# Patient Record
Sex: Male | Born: 1955 | Hispanic: No | Marital: Married | Smoking: Never smoker
Health system: Southern US, Community
[De-identification: ages and names within clinical notes are randomized; demographics above are authoritative.]

## PROBLEM LIST (undated history)

## (undated) DIAGNOSIS — I10 Essential (primary) hypertension: Secondary | ICD-10-CM

## (undated) DIAGNOSIS — K219 Gastro-esophageal reflux disease without esophagitis: Secondary | ICD-10-CM

## (undated) HISTORY — DX: Gastro-esophageal reflux disease without esophagitis: K21.9

## (undated) HISTORY — DX: Essential (primary) hypertension: I10

---

## 2015-02-26 DIAGNOSIS — I10 Essential (primary) hypertension: Secondary | ICD-10-CM | POA: Insufficient documentation

## 2015-03-05 DIAGNOSIS — Z8249 Family history of ischemic heart disease and other diseases of the circulatory system: Secondary | ICD-10-CM | POA: Insufficient documentation

## 2015-04-17 DIAGNOSIS — E7800 Pure hypercholesterolemia, unspecified: Secondary | ICD-10-CM | POA: Insufficient documentation

## 2018-01-15 DIAGNOSIS — Z01810 Encounter for preprocedural cardiovascular examination: Secondary | ICD-10-CM | POA: Insufficient documentation

## 2018-06-06 LAB — HM COLONOSCOPY

## 2019-12-11 ENCOUNTER — Ambulatory Visit: Payer: Self-pay

## 2019-12-11 ENCOUNTER — Encounter: Payer: Self-pay | Admitting: Family Medicine

## 2019-12-11 ENCOUNTER — Ambulatory Visit (INDEPENDENT_AMBULATORY_CARE_PROVIDER_SITE_OTHER): Payer: Medicare Other | Admitting: Family Medicine

## 2019-12-11 ENCOUNTER — Other Ambulatory Visit: Payer: Self-pay

## 2019-12-11 DIAGNOSIS — R202 Paresthesia of skin: Secondary | ICD-10-CM | POA: Diagnosis not present

## 2019-12-11 DIAGNOSIS — M542 Cervicalgia: Secondary | ICD-10-CM | POA: Diagnosis not present

## 2019-12-11 DIAGNOSIS — R200 Anesthesia of skin: Secondary | ICD-10-CM

## 2019-12-11 MED ORDER — GABAPENTIN 100 MG PO CAPS: ORAL_CAPSULE | ORAL | 3 refills | Status: DC

## 2019-12-11 MED ORDER — PREDNISONE 10 MG PO TABS: ORAL_TABLET | ORAL | 0 refills | Status: DC

## 2019-12-11 MED ORDER — HYDROCODONE-ACETAMINOPHEN 5-325 MG PO TABS: 1.0000 | ORAL_TABLET | Freq: Every evening | ORAL | 0 refills | Status: DC | PRN

## 2019-12-11 NOTE — Progress Notes (Signed)
   Office Visit Note   Patient: Steven Sanford           Date of Birth: 1956/11/27           MRN: [id number] Visit Date: 12/11/2019 Requested by: No referring provider defined for this encounter. PCP: Patient, No Pcp Per  Subjective: Chief Complaint  Patient presents with  . Neck - Pain    7-79months ago-Tingling/numbness on left side. Pain in neck area. Getting worse. Difficult sleeping.Took Codiene. Would like something for nerve pain. Hurts to turn neck around. Weakness on left arm. Uses left hand alot but is right handed.     HPI: He is here with neck and left arm pain.  He is right-hand dominant.  About 6 or 7 months ago he started noticing numbness and tingling on the dorsum of his left hand and wrist.  The symptoms gradually got a little bit worse, extending proximally.  In the past month or 2, his neck started hurting on the left side with pain in the shoulder blade area as well.  He has not noticed any weakness.  He took some codeine for pain and it helped him to get some rest.  He is not from this area, but his 65 year old mother-in-law recently died and he is here for a few weeks to help settle her estate.  He has a history of bilateral shoulder dislocations many years ago and has done well from that standpoint.  He has never had any troubles with his neck.               ROS: He is otherwise in good health.  All other systems were reviewed and are negative.  Objective: Vital Signs: There were no vitals taken for this visit.  Physical Exam:  General:  Alert and oriented, in no acute distress. Pulm:  Breathing unlabored. Psy:  Normal mood, congruent affect. Skin: No visible rash. Neck: He has good range of motion with pain at the extreme of rotation to the left and upward gaze.  Spurling's test is equivocal.  He is tender to palpation to the left of C7-T1, and he also has a trigger point in the rhomboid area on the left that is symptomatic.  Upper extremity strength and  reflexes are normal.  Negative Tinel's at the carpal tunnel.  Light touch sensation is grossly normal.   Imaging: X-ray cervical spine: Overall well-preserved disc spaces.  Mild facet joint arthropathy.    Assessment & Plan: 1.  Neck and left arm pain, concerning for cervical radiculopathy -We will try physical therapy.  Medications given for pain relief.  If he fails to improve, MRI cervical spine followed by possible epidural injections.     Procedures: No procedures performed  No notes on file     PMFS History: There are no problems to display for this patient.  History reviewed. No pertinent past medical history.  History reviewed. No pertinent family history.  History reviewed. No pertinent surgical history. Social History   Occupational History  . Not on file  Tobacco Use  . Smoking status: Never Smoker  . Smokeless tobacco: Never Used  Substance and Sexual Activity  . Alcohol use: Not on file  . Drug use: Not on file  . Sexual activity: Not on file

## 2019-12-12 ENCOUNTER — Ambulatory Visit: Payer: Medicare Other | Attending: Family Medicine

## 2019-12-12 ENCOUNTER — Other Ambulatory Visit: Payer: Self-pay

## 2019-12-12 DIAGNOSIS — M79602 Pain in left arm: Secondary | ICD-10-CM | POA: Insufficient documentation

## 2019-12-12 DIAGNOSIS — R252 Cramp and spasm: Secondary | ICD-10-CM | POA: Insufficient documentation

## 2019-12-12 DIAGNOSIS — M546 Pain in thoracic spine: Secondary | ICD-10-CM | POA: Diagnosis present

## 2019-12-12 DIAGNOSIS — M542 Cervicalgia: Secondary | ICD-10-CM | POA: Diagnosis present

## 2019-12-12 NOTE — Patient Instructions (Addendum)
.  Cervico-Thoracic: Extension / Rotation (Sitting)    Reach across body with left arm and grasp back of chair. Gently look over right side shoulder. Hold __20__ seconds. Relax. Repeat __3__ times per set. Do _1___ sets per session. Do __3__ sessions per day.  [email or web address]   Copyright  VHI. All rights reserved.   Access Code: [email or web address]  URL: [email or web address]  Date: 12/12/2019  Prepared by: Sigurd Sos   Exercises Sidelying Open Book Thoracic Rotation with Knee on Foam Roll - 10 reps - 1 sets - 2x daily - 7x weekly Child's Pose Stretch - 10 reps - 3 sets - 1x daily - 7x weekly Child's Pose with Sidebending - 10 reps - 3 sets - 1x daily - 7x weekly Seated Correct Posture - 10 reps - 3 sets - 1x daily - 7x weekly  [facility] [address], [place], [place] [address] Phone # [phone] Fax [phone]

## 2019-12-12 NOTE — Therapy (Signed)
[facility] [address], [place], [place], [address] Phone: [phone]   Fax:  [phone]  Physical Therapy Evaluation  Patient Details  Name: Steven Sanford MRN: [id number] Date of Birth: 15-Feb-1956 Referring Provider (PT): Jerl Mina, MD   Encounter Date: 12/12/2019  PT End of Session - 12/12/19 1056    Visit Number  1    Date for PT Re-Evaluation  02/06/20    Authorization Type  UHC Medicare    PT Start Time  1017    PT Stop Time  1057    PT Time Calculation (min)  40 min    Activity Tolerance  Patient tolerated treatment well    Behavior During Therapy  [facility] for tasks assessed/performed       Past Medical History:  Diagnosis Date  . Hypertension     History reviewed. No pertinent surgical history.  There were no vitals filed for this visit.   Subjective Assessment - 12/12/19 0956    Subjective  Pt presents to PT with neck pain and Lt UE numbness and tingling that began 8 months ago.  Pt had a recent flare-up when driving from [place] 2 weeks ago.    Diagnostic tests  x-ray: normal joint space in the cervical spine    Patient Stated Goals  reduce Lt UE radiculopathy    Currently in Pain?  Yes    Pain Score  6    up to 8-9/10   Pain Location  Arm   cervical   Pain Orientation  Left    Pain Descriptors / Indicators  Burning;Stabbing;Pressure;Spasm    Pain Type  Chronic pain    Pain Radiating Towards  Lt scapula to Lt UE    Pain Onset  More than a month ago    Pain Frequency  Intermittent    Aggravating Factors   sitting, driving, sleep    Pain Relieving Factors  change of position         [facility] PT Assessment - 12/12/19 0001      Assessment   Medical Diagnosis  neck pain, numbness and tingling in Lt arm    Referring Provider (PT)  Jerl Mina, MD    Onset Date/Surgical Date  11/12/19    Hand Dominance  Right    Next MD Visit  if needed      Precautions   Precautions  None       Restrictions   Weight Bearing Restrictions  No      Balance Screen   Has the patient fallen in the past 6 months  No    Has the patient had a decrease in activity level because of a fear of falling?   No    Is the patient reluctant to leave their home because of a fear of falling?   No      Home Film/video editor residence    Living Arrangements  Spouse/significant other      Prior Function   Level of Independence  Independent    Vocation  Part time employment      Cognition   Overall Cognitive Status  Within Functional Limits for tasks assessed      Observation/Other Assessments   Focus on Therapeutic Outcomes (FOTO)   42% limitation      Posture/Postural Control   Posture/Postural Control  Postural limitations    Postural Limitations  Forward head;Rounded Shoulders      ROM / Strength  AROM / PROM / Strength  AROM;PROM;Strength      AROM   Overall AROM   Deficits    Overall AROM Comments  UE A/ROM is limited by 25% due to flexibility.  Thoracic mobility and A/ROM is limited by 75% into flexion and extension and 50% in bil sidebending    AROM Assessment Site  Cervical    Cervical Flexion  45    Cervical - Right Side Bend  35    Cervical - Left Side Bend  30    Cervical - Right Rotation  80    Cervical - Left Rotation  80      PROM   Overall PROM   Within functional limits for tasks performed    Overall PROM Comments  full passive cervical A/ROm      Strength   Overall Strength  Within functional limits for tasks performed    Overall Strength Comments  Rt=Lt UE strength 4+/5 to 5/5      Palpation   Spinal mobility  significant cervical and thoracic mobility with pain T2-8    Palpation comment  trigger points in Lt rhomboids, subscapularis, thoracic paraspinals and Lt>Rt cervical paraspinals      Special Tests    Special Tests  Cervical    Cervical Tests  Spurling's;Dictraction      Spurling's   Findings  Negative    Side  Left       Distraction Test   Findngs  Negative    side  Left      Ambulation/Gait   Gait Pattern  Within Functional Limits                Objective measurements completed on examination: See above findings.              PT Education - 12/12/19 1055    Education Details  Access Code: [email or web address]    Person(s) Educated  Patient    Methods  Explanation;Demonstration;Handout    Comprehension  Verbalized understanding;Returned demonstration       PT Short Term Goals - 12/12/19 1010      PT SHORT TERM GOAL #1   Title  be independent in intial HEP    Time  4    Period  Weeks    Status  New    Target Date  01/09/20      PT SHORT TERM GOAL #2   Title  reduce Lt UE pain by 25% with sitting and driving    Time  4    Period  Weeks    Status  New    Target Date  01/09/20      PT SHORT TERM GOAL #3   Title  improve sleep quality and quantity by 25%    Time  4    Period  Weeks    Status  New    Target Date  01/09/20        PT Long Term Goals - 12/12/19 1010      PT LONG TERM GOAL #1   Title  be independent in advanced HEP    Time  8    Period  Weeks    Target Date  02/06/20      PT LONG TERM GOAL #2   Title  reduce FOTO to < or = to 29% limitation    Time  8    Period  Weeks    Status  New    Target Date  02/06/20  PT LONG TERM GOAL #3   Title  reduce Lt UE radiculopathy by 60% to improve sitting and driving tolerance    Time  8    Period  Weeks    Status  New    Target Date  02/06/20      PT LONG TERM GOAL #4   Title  demonstrate neutral seated posture and report frequent corrections during the day to reduce muscle tension    Time  8    Period  Weeks    Status  New    Target Date  02/06/20      PT LONG TERM GOAL #5   Title  report a 60% improvement in the quality and quantity of sleep    Time  8    Period  Weeks    Status  New    Target Date  02/06/20             Plan - 12/12/19 1231    Clinical Impression Statement  Pt  presents to PT with an 8 month history of neck pain and Lt UE radiculopathy.  Pt drove from [place] ~2-3 weeks ago and began to have increased Lt UE pain after that drive. Pt had x-ray yesterday and disc space was normal.  Pt rates the pain as 5-8/10 overall in the Lt neck and UE.  Pt describes pain in his Lt scapula as stabbing and intense.  Pt with significant guarding in sitting with scapular elevation and forward head.  Pt with reduced cervical and UE A/ROM and significant reduction in thoracic mobility and A/ROM.  Pt will benefit from skilled PT to address neck pain, Lt UE radiculopathy, postural dysfunction and cervical/thoracic mobility.    Personal Factors and Comorbidities  Age    Examination-Activity Limitations  Lift    Examination-Participation Restrictions  Driving    Stability/Clinical Decision Making  Stable/Uncomplicated    Clinical Decision Making  Low    Rehab Potential  Good    PT Frequency  2x / week    PT Duration  8 weeks    PT Treatment/Interventions  ADLs/Self Care Home Management;Cryotherapy;Electrical Stimulation;Ultrasound;Traction;Moist Heat;Functional mobility training;Therapeutic activities;Therapeutic exercise;Neuromuscular re-education;Manual techniques;Patient/family education;Passive range of motion;Dry needling;Spinal Manipulations    PT Next Visit Plan  dry needling to neck, thoracic and rhomboids/subscapularis, thoracic mobility, cervical traction    PT Home Exercise Plan  Access Code: [email or web address]    Consulted and Agree with Plan of Care  Patient       Patient will benefit from skilled therapeutic intervention in order to improve the following deficits and impairments:  Decreased activity tolerance, Decreased endurance, Postural dysfunction, Improper body mechanics, Impaired flexibility, Pain, Increased muscle spasms, Decreased range of motion, Hypomobility  Visit Diagnosis: Pain in left arm - Plan: PT plan of care cert/re-cert  Cervicalgia - Plan: PT plan  of care cert/re-cert  Pain in thoracic spine - Plan: PT plan of care cert/re-cert  Cramp and spasm - Plan: PT plan of care cert/re-cert     Problem List There are no problems to display for this patient.    Sigurd Sos, PT 12/12/19 1:14 PM  [facility] [address] [place], [place], [address] Phone: [phone]   Fax:  [phone]  Name: Jatin Kingry MRN: [id number] Date of Birth: 1956-01-24

## 2019-12-16 ENCOUNTER — Other Ambulatory Visit: Payer: Self-pay

## 2019-12-16 ENCOUNTER — Ambulatory Visit: Payer: Medicare Other

## 2019-12-16 DIAGNOSIS — M79602 Pain in left arm: Secondary | ICD-10-CM

## 2019-12-16 DIAGNOSIS — M546 Pain in thoracic spine: Secondary | ICD-10-CM

## 2019-12-16 DIAGNOSIS — M542 Cervicalgia: Secondary | ICD-10-CM

## 2019-12-16 NOTE — Patient Instructions (Signed)

## 2019-12-16 NOTE — Therapy (Signed)
[facility] [address], [place], [place], [address] Phone: [phone]   Fax:  [phone]  Physical Therapy Treatment  Patient Details  Name: Steven Sanford MRN: [id number] Date of Birth: Feb 21, 1956 Referring Provider (PT): Jerl Mina, MD   Encounter Date: 12/16/2019  PT End of Session - 12/16/19 1310    Visit Number  2    Date for PT Re-Evaluation  02/06/20    Authorization Type  UHC Medicare    PT Start Time  1235   dry needling   PT Stop Time  1312    PT Time Calculation (min)  37 min    Activity Tolerance  Patient tolerated treatment well    Behavior During Therapy  [facility] for tasks assessed/performed       Past Medical History:  Diagnosis Date  . Hypertension     History reviewed. No pertinent surgical history.  There were no vitals filed for this visit.  Subjective Assessment - 12/16/19 1236    Subjective  I think the prednisone is helping.  I am sleeping better now.  I have not been doing the stretches- I have been very busy    Diagnostic tests  x-ray: normal joint space in the cervical spine    Patient Stated Goals  reduce Lt UE radiculopathy    Currently in Pain?  Yes    Pain Score  3     Pain Location  Arm    Pain Orientation  Left    Pain Descriptors / Indicators  Burning;Pressure;Spasm    Pain Type  Chronic pain    Pain Onset  More than a month ago    Aggravating Factors   siting, driving, sleep    Pain Relieving Factors  change of position                       [facility] Adult PT Treatment/Exercise - 12/16/19 0001      Manual Therapy   Manual Therapy  Soft tissue mobilization;Myofascial release    Manual therapy comments  elongation to Lt rhomboids, subscapularis, cervical paraspinals       Trigger Point Dry Needling - 12/16/19 0001    Consent Given?  Yes    Education Handout Provided  Yes    Muscles Treated Head and Neck  Upper trapezius;Cervical multifidi     Muscles Treated Upper Quadrant  Rhomboids;Subscapularis   Lt only   Other Dry Needling  thoracic multifidi    Upper Trapezius Response  Twitch reponse elicited;Palpable increased muscle length    Cervical multifidi Response  Twitch reponse elicited;Palpable increased muscle length    Rhomboids Response  Twitch response elicited;Palpable increased muscle length    Subscapularis Response  Twitch response elicited;Palpable increased muscle length           PT Education - 12/16/19 1239    Education Details  DN info    Person(s) Educated  Patient    Methods  Explanation;Demonstration;Handout    Comprehension  Verbalized understanding;Returned demonstration       PT Short Term Goals - 12/12/19 1010      PT SHORT TERM GOAL #1   Title  be independent in intial HEP    Time  4    Period  Weeks    Status  New    Target Date  01/09/20      PT SHORT TERM GOAL #2   Title  reduce Lt UE pain by 25% with sitting and  driving    Time  4    Period  Weeks    Status  New    Target Date  01/09/20      PT SHORT TERM GOAL #3   Title  improve sleep quality and quantity by 25%    Time  4    Period  Weeks    Status  New    Target Date  01/09/20        PT Long Term Goals - 12/12/19 1010      PT LONG TERM GOAL #1   Title  be independent in advanced HEP    Time  8    Period  Weeks    Target Date  02/06/20      PT LONG TERM GOAL #2   Title  reduce FOTO to < or = to 29% limitation    Time  8    Period  Weeks    Status  New    Target Date  02/06/20      PT LONG TERM GOAL #3   Title  reduce Lt UE radiculopathy by 60% to improve sitting and driving tolerance    Time  8    Period  Weeks    Status  New    Target Date  02/06/20      PT LONG TERM GOAL #4   Title  demonstrate neutral seated posture and report frequent corrections during the day to reduce muscle tension    Time  8    Period  Weeks    Status  New    Target Date  02/06/20      PT LONG TERM GOAL #5   Title  report a  60% improvement in the quality and quantity of sleep    Time  8    Period  Weeks    Status  New    Target Date  02/06/20            Plan - 12/16/19 1316    Clinical Impression Statement  Pt with first time follow-up today.  Pt with reduced compliance with HEP due to death in his family and being busy with arrangements.  Pt with reduced pain overall due to taking Prednisone for the past 5 days.  Pt with significant muscle spasm in Lt rhomboids and moderate tension in thoracic and cervical multifidi.  Pt with reduced pain and tension after manual therapy today.  Pt will benefit from skilled PT for thoracic and cervical flexibility, manual to address reduced segmental and muscular mobility.    PT Frequency  2x / week    PT Duration  8 weeks    PT Treatment/Interventions  ADLs/Self Care Home Management;Cryotherapy;Electrical Stimulation;Ultrasound;Traction;Moist Heat;Functional mobility training;Therapeutic activities;Therapeutic exercise;Neuromuscular re-education;Manual techniques;Patient/family education;Passive range of motion;Dry needling;Spinal Manipulations    PT Next Visit Plan  assess response to dry needling, thoracic mobility, manual to Lt rhomboids, cervical traction    PT Home Exercise Plan  Access Code: [email or web address]    Consulted and Agree with Plan of Care  Patient       Patient will benefit from skilled therapeutic intervention in order to improve the following deficits and impairments:  Decreased activity tolerance, Decreased endurance, Postural dysfunction, Improper body mechanics, Impaired flexibility, Pain, Increased muscle spasms, Decreased range of motion, Hypomobility  Visit Diagnosis: Cervicalgia  Pain in left arm  Pain in thoracic spine     Problem List There are no problems to display for this patient.    Sigurd Sos,  PT 12/16/19 1:17 PM  [facility] [address], [place], [place],  [address] Phone: [phone]   Fax:  [phone]  Name: Raidel Haney MRN: [id number] Date of Birth: 02-Jun-1956

## 2019-12-18 ENCOUNTER — Other Ambulatory Visit: Payer: Self-pay

## 2019-12-18 ENCOUNTER — Ambulatory Visit: Payer: Medicare Other

## 2019-12-18 DIAGNOSIS — M542 Cervicalgia: Secondary | ICD-10-CM

## 2019-12-18 DIAGNOSIS — M546 Pain in thoracic spine: Secondary | ICD-10-CM

## 2019-12-18 DIAGNOSIS — R252 Cramp and spasm: Secondary | ICD-10-CM

## 2019-12-18 DIAGNOSIS — M79602 Pain in left arm: Secondary | ICD-10-CM | POA: Diagnosis not present

## 2019-12-18 NOTE — Therapy (Signed)
[facility] [address], [place], [place], [address] Phone: [phone]   Fax:  [phone]  Physical Therapy Treatment  Patient Details  Name: Steven Sanford MRN: [id number] Date of Birth: [birth year] Referring Provider (PT): Jerl Mina, MD   Encounter Date: 12/18/2019  PT End of Session - 12/18/19 1228    Visit Number  3    Date for PT Re-Evaluation  02/06/20    Authorization Type  UHC Medicare    PT Start Time  1147   dry needling   PT Stop Time  [id number]    PT Time Calculation (min)  48 min    Activity Tolerance  Patient tolerated treatment well    Behavior During Therapy  [facility] for tasks assessed/performed       Past Medical History:  Diagnosis Date  . Hypertension     History reviewed. No pertinent surgical history.  There were no vitals filed for this visit.  Subjective Assessment - 12/18/19 1150    Subjective  I am not taking pain medication now.  I was sore after treatment and now I feel better.    Currently in Pain?  Yes    Pain Score  2     Pain Location  Arm    Pain Orientation  Left    Pain Descriptors / Indicators  Tingling;Burning                       [facility] Adult PT Treatment/Exercise - 12/18/19 0001      Modalities   Modalities  Traction      Traction   Type of Traction  Cervical    Min (lbs)  10    Max (lbs)  15    Hold Time  60    Rest Time  20    Time  10      Manual Therapy   Manual Therapy  Soft tissue mobilization;Myofascial release    Manual therapy comments  elongation to Lt rhomboids, subscapularis, cervical paraspinals       Trigger Point Dry Needling - 12/18/19 0001    Consent Given?  Yes    Muscles Treated Head and Neck  Upper trapezius;Cervical multifidi    Muscles Treated Upper Quadrant  Rhomboids;Subscapularis   Lt only   Other Dry Needling  thoracic multifidi    Upper Trapezius Response  Twitch reponse elicited;Palpable increased muscle  length    Cervical multifidi Response  Twitch reponse elicited;Palpable increased muscle length    Rhomboids Response  Twitch response elicited;Palpable increased muscle length    Subscapularis Response  Twitch response elicited;Palpable increased muscle length             PT Short Term Goals - 12/12/19 1010      PT SHORT TERM GOAL #1   Title  be independent in intial HEP    Time  4    Period  Weeks    Status  New    Target Date  01/09/20      PT SHORT TERM GOAL #2   Title  reduce Lt UE pain by 25% with sitting and driving    Time  4    Period  Weeks    Status  New    Target Date  01/09/20      PT SHORT TERM GOAL #3   Title  improve sleep quality and quantity by 25%    Time  4    Period  Weeks  Status  New    Target Date  01/09/20        PT Long Term Goals - 12/12/19 1010      PT LONG TERM GOAL #1   Title  be independent in advanced HEP    Time  8    Period  Weeks    Target Date  02/06/20      PT LONG TERM GOAL #2   Title  reduce FOTO to < or = to 29% limitation    Time  8    Period  Weeks    Status  New    Target Date  02/06/20      PT LONG TERM GOAL #3   Title  reduce Lt UE radiculopathy by 60% to improve sitting and driving tolerance    Time  8    Period  Weeks    Status  New    Target Date  02/06/20      PT LONG TERM GOAL #4   Title  demonstrate neutral seated posture and report frequent corrections during the day to reduce muscle tension    Time  8    Period  Weeks    Status  New    Target Date  02/06/20      PT LONG TERM GOAL #5   Title  report a 60% improvement in the quality and quantity of sleep    Time  8    Period  Weeks    Status  New    Target Date  02/06/20            Plan - 12/18/19 1227    Clinical Impression Statement  Pt reports reduced Lt scapular pain after manual last session.  Pt with significant spasm in Lt rhomboids and responded well to manual therapy today with reduced size and tension in spasm after dry  needling and soft tissue work. Pt has been more consistent with HEP for flexibility.  Pt tolerated trial of traction well today.  Pt will continue to benefit from skilled PT to address Lt UE radiculopathy and muscle tension.    PT Frequency  2x / week    PT Duration  8 weeks    PT Treatment/Interventions  ADLs/Self Care Home Management;Cryotherapy;Electrical Stimulation;Ultrasound;Traction;Moist Heat;Functional mobility training;Therapeutic activities;Therapeutic exercise;Neuromuscular re-education;Manual techniques;Patient/family education;Passive range of motion;Dry needling;Spinal Manipulations    PT Next Visit Plan  assess response to dry needling, thoracic mobility, manual to Lt rhomboids, cervical traction if helpful    PT Home Exercise Plan  Access Code: [email or web address]    Recommended Other Services  initial certification is signed    Consulted and Agree with Plan of Care  Patient       Patient will benefit from skilled therapeutic intervention in order to improve the following deficits and impairments:  Decreased activity tolerance, Decreased endurance, Postural dysfunction, Improper body mechanics, Impaired flexibility, Pain, Increased muscle spasms, Decreased range of motion, Hypomobility  Visit Diagnosis: Cervicalgia  Pain in left arm  Pain in thoracic spine  Cramp and spasm     Problem List There are no problems to display for this patient.   Sigurd Sos, PT 12/18/19 12:30 PM  [facility] [address] [place], [place], [address] Phone: [phone]   Fax:  [phone]  Name: Taria Wisecup MRN: [id number] Date of Birth: June 18, 1956

## 2019-12-25 ENCOUNTER — Ambulatory Visit: Payer: Medicare Other

## 2019-12-25 ENCOUNTER — Other Ambulatory Visit: Payer: Self-pay

## 2019-12-25 DIAGNOSIS — R252 Cramp and spasm: Secondary | ICD-10-CM

## 2019-12-25 DIAGNOSIS — M542 Cervicalgia: Secondary | ICD-10-CM

## 2019-12-25 DIAGNOSIS — M79602 Pain in left arm: Secondary | ICD-10-CM | POA: Diagnosis not present

## 2019-12-25 DIAGNOSIS — M546 Pain in thoracic spine: Secondary | ICD-10-CM

## 2019-12-25 NOTE — Therapy (Signed)
[facility] [address], [place], [place], [address] Phone: [phone]   Fax:  [phone]  Physical Therapy Treatment  Patient Details  Name: Steven Sanford MRN: [id number] Date of Birth: 12-28-1955 Referring Provider (PT): Jerl Mina, MD   Encounter Date: 12/25/2019  PT End of Session - 12/25/19 1613    Visit Number  4    Date for PT Re-Evaluation  02/06/20    Authorization Type  [employer] Medicare    PT Start Time  [id number]    PT Stop Time  1622    PT Time Calculation (min)  48 min    Activity Tolerance  Patient tolerated treatment well    Behavior During Therapy  [facility] for tasks assessed/performed       Past Medical History:  Diagnosis Date  . Hypertension     History reviewed. No pertinent surgical history.  There were no vitals filed for this visit.  Subjective Assessment - 12/25/19 1536    Subjective  The muscle tension is down.  My Lt was very painful last night at night.    Pertinent History  Pt lives in [place]- he is here due to death of his mother in law    Patient Stated Goals  reduce Lt UE radiculopathy    Currently in Pain?  Yes    Pain Score  3    up to 7-8/10   Pain Location  Arm    Pain Orientation  Left    Pain Descriptors / Indicators  Tingling    Pain Type  Chronic pain    Pain Onset  More than a month ago    Pain Frequency  Constant    Aggravating Factors   sleep, sitting too long at computer    Pain Relieving Factors  chang of position, topical rub                       [facility] Adult PT Treatment/Exercise - 12/25/19 0001      Modalities   Modalities  Traction      Traction   Type of Traction  Cervical    Min (lbs)  10    Max (lbs)  15    Hold Time  60    Rest Time  20    Time  10      Manual Therapy   Manual Therapy  Soft tissue mobilization;Myofascial release    Manual therapy comments  elongation to Lt rhomboids, subscapularis, cervical paraspinals        Trigger Point Dry Needling - 12/25/19 0001    Consent Given?  Yes    Muscles Treated Upper Quadrant  Rhomboids;Subscapularis   Lt only   Other Dry Needling  thoracic multifidi    Rhomboids Response  Twitch response elicited;Palpable increased muscle length    Subscapularis Response  Twitch response elicited;Palpable increased muscle length             PT Short Term Goals - 12/25/19 1538      PT SHORT TERM GOAL #1   Title  be independent in intial HEP    Status  Achieved      PT SHORT TERM GOAL #2   Title  reduce Lt UE pain by 25% with sitting and driving    Baseline  50%    Status  Achieved      PT SHORT TERM GOAL #3   Title  improve sleep quality and quantity by 25%  Baseline  50% improvement    Status  Achieved        PT Long Term Goals - 12/12/19 1010      PT LONG TERM GOAL #1   Title  be independent in advanced HEP    Time  8    Period  Weeks    Target Date  02/06/20      PT LONG TERM GOAL #2   Title  reduce FOTO to < or = to 29% limitation    Time  8    Period  Weeks    Status  New    Target Date  02/06/20      PT LONG TERM GOAL #3   Title  reduce Lt UE radiculopathy by 60% to improve sitting and driving tolerance    Time  8    Period  Weeks    Status  New    Target Date  02/06/20      PT LONG TERM GOAL #4   Title  demonstrate neutral seated posture and report frequent corrections during the day to reduce muscle tension    Time  8    Period  Weeks    Status  New    Target Date  02/06/20      PT LONG TERM GOAL #5   Title  report a 60% improvement in the quality and quantity of sleep    Time  8    Period  Weeks    Status  New    Target Date  02/06/20            Plan - 12/25/19 1612    Clinical Impression Statement  Pt with 50% reduction in Lt UE and rhomboid pain since the start of care and improved sleep.  Pt with increased Lt UE radiculopathy last night with sleep.  Pt with increased Lt UE tingling with rounded shoulder  posture and cervical rotation.  Radiculopathy resolves with scapular retraction.  Pt with trigger point over Lt rhomboids and demonstrated improved tissue mobility after manual therapy and dry needling today.  Pt will continue to benefit from skilled PT to address muscle tension, pain and Lt UE radiculopathy.    PT Frequency  2x / week    PT Duration  8 weeks    PT Treatment/Interventions  ADLs/Self Care Home Management;Cryotherapy;Electrical Stimulation;Ultrasound;Traction;Moist Heat;Functional mobility training;Therapeutic activities;Therapeutic exercise;Neuromuscular re-education;Manual techniques;Patient/family education;Passive range of motion;Dry needling;Spinal Manipulations    PT Next Visit Plan  assess response to dry needling, thoracic mobility, manual to Lt rhomboids, cervical traction if helpful.  Add postural strength exercises    PT Home Exercise Plan  Access Code: [email or web address]    Consulted and Agree with Plan of Care  Patient       Patient will benefit from skilled therapeutic intervention in order to improve the following deficits and impairments:  Decreased activity tolerance, Decreased endurance, Postural dysfunction, Improper body mechanics, Impaired flexibility, Pain, Increased muscle spasms, Decreased range of motion, Hypomobility  Visit Diagnosis: Cervicalgia  Pain in left arm  Pain in thoracic spine  Cramp and spasm     Problem List There are no problems to display for this patient.   Sigurd Sos, PT 12/25/19 4:18 PM  [facility] [address] [place], [place], [address] Phone: [phone]   Fax:  [phone]  Name: Steven Sanford MRN: [id number] Date of Birth: 1956/02/07

## 2019-12-31 ENCOUNTER — Ambulatory Visit: Payer: Medicare Other | Attending: Family Medicine

## 2019-12-31 ENCOUNTER — Other Ambulatory Visit: Payer: Self-pay

## 2019-12-31 DIAGNOSIS — M546 Pain in thoracic spine: Secondary | ICD-10-CM | POA: Diagnosis present

## 2019-12-31 DIAGNOSIS — R252 Cramp and spasm: Secondary | ICD-10-CM | POA: Insufficient documentation

## 2019-12-31 DIAGNOSIS — M79602 Pain in left arm: Secondary | ICD-10-CM | POA: Diagnosis present

## 2019-12-31 DIAGNOSIS — M542 Cervicalgia: Secondary | ICD-10-CM | POA: Insufficient documentation

## 2019-12-31 NOTE — Therapy (Addendum)
[facility] [address] [place], [place], [address] Phone: [phone]   Fax:  [phone]  Physical Therapy Treatment  Patient Details  Name: Steven Sanford MRN: [id number] Date of Birth: 1956-10-07 Referring Provider (PT): Eunice Blase, MD    Encounter Date: 12/31/2019  PT End of Session - 12/31/19 1226    Visit Number  5    Date for PT Re-Evaluation  02/06/20    Authorization Type  [employer] Medicare    PT Start Time  1941    PT Stop Time  1236    PT Time Calculation (min)  48 min    Activity Tolerance  Patient tolerated treatment well       Past Medical History:  Diagnosis Date  . Hypertension     History reviewed. No pertinent surgical history.  There were no vitals filed for this visit.  Subjective Assessment - 12/31/19 1151    Subjective  I had a rough night last night.  Pt reports 40% overall improvement since the start of care.    Currently in Pain?  Yes    Pain Score  3    up to 5-6/10   Pain Location  Arm    Pain Orientation  Left    Pain Descriptors / Indicators  Tingling    Pain Type  Chronic pain    Pain Radiating Towards  Lt scapula to the Lt UE    Pain Onset  More than a month ago    Pain Frequency  Constant    Aggravating Factors   sleep, sitting too long at computer    Pain Relieving Factors  change of position, topical rub                       [facility] Adult PT Treatment/Exercise - 12/31/19 0001      Traction   Type of Traction  Cervical    Min (lbs)  10    Max (lbs)  18    Hold Time  60    Rest Time  20    Time  10      Manual Therapy   Manual Therapy  Soft tissue mobilization;Myofascial release    Manual therapy comments  elongation to Lt rhomboids, subscapularis, cervical paraspinals       Trigger Point Dry Needling - 12/31/19 0001    Consent Given?  Yes    Muscles Treated Head and Neck  Upper trapezius;Cervical multifidi    Muscles Treated Upper  Quadrant  Rhomboids;Subscapularis   Lt only   Other Dry Needling  thoracic multifidi    Upper Trapezius Response  Twitch reponse elicited;Palpable increased muscle length    Cervical multifidi Response  Twitch reponse elicited;Palpable increased muscle length    Rhomboids Response  Twitch response elicited;Palpable increased muscle length    Subscapularis Response  Twitch response elicited;Palpable increased muscle length             PT Short Term Goals - 12/25/19 1538      PT SHORT TERM GOAL #1   Title  be independent in intial HEP    Status  Achieved      PT SHORT TERM GOAL #2   Title  reduce Lt UE pain by 25% with sitting and driving    Baseline  50%    Status  Achieved      PT SHORT TERM GOAL #3   Title  improve sleep quality and quantity by 25%  Baseline  50% improvement    Status  Achieved        PT Long Term Goals - 12/31/19 1154      PT LONG TERM GOAL #1   Title  be independent in advanced HEP    Period  Weeks    Status  On-going      PT LONG TERM GOAL #3   Title  reduce Lt UE radiculopathy by 60% to improve sitting and driving tolerance    Baseline  40% limitation    Time  8    Period  Weeks    Status  On-going      PT LONG TERM GOAL #4   Title  demonstrate neutral seated posture and report frequent corrections during the day to reduce muscle tension    Time  8    Period  Weeks    Status  On-going            Plan - 12/31/19 1224    Clinical Impression Statement  Pt with continued Lt UE pain that is worse at night.  Pt reports 40-50% overall improvement in symptoms since the start of care.  Pt has tension and trigger points in upper cervical spine and Lt rhomboids that are improved significantly. Pt demonstrated improved tissue mobility after manual therapy today.  PT increased the traction tension today and pt tolerated it well.  Pt will continue to benefit from continued PT to address Lt UE radiculopathy.    PT Frequency  2x / week    PT  Duration  8 weeks    PT Treatment/Interventions  ADLs/Self Care Home Management;Cryotherapy;Electrical Stimulation;Ultrasound;Traction;Moist Heat;Functional mobility training;Therapeutic activities;Therapeutic exercise;Neuromuscular re-education;Manual techniques;Patient/family education;Passive range of motion;Dry needling;Spinal Manipulations    PT Next Visit Plan  assess response to dry needling, thoracic mobility, manual to Lt rhomboids, continue cervical traction if helpful.  Add postural strength exercises when pt returns after trip    PT Home Exercise Plan  Access Code: [facility]       Patient will benefit from skilled therapeutic intervention in order to improve the following deficits and impairments:  Decreased activity tolerance, Decreased endurance, Postural dysfunction, Improper body mechanics, Impaired flexibility, Pain, Increased muscle spasms, Decreased range of motion, Hypomobility  Visit Diagnosis: Cervicalgia  Pain in left arm  Pain in thoracic spine  Cramp and spasm     Problem List There are no problems to display for this patient.  PHYSICAL THERAPY DISCHARGE SUMMARY  Visits from Start of Care: 5  Current functional level related to goals / functional outcomes: See above for current status.  Pt didn't return to PT.     Remaining deficits: See above for current status.     Education / Equipment: HEP, posture Plan: Patient agrees to discharge.  Patient goals were not met. Patient is being discharged due to not returning since the last visit.  ?????        Steven Sanford, PT 12/31/19 12:27 PM  [facility] [address] [place], [place], [address] Phone: [phone]   Fax:  [phone]  Name: Steven Sanford MRN: [id number] Date of Birth: April 17, 1956

## 2020-01-02 ENCOUNTER — Other Ambulatory Visit: Payer: Self-pay

## 2020-01-02 ENCOUNTER — Ambulatory Visit: Payer: 59 | Attending: Family

## 2020-01-02 DIAGNOSIS — Z20822 Contact with and (suspected) exposure to covid-19: Secondary | ICD-10-CM

## 2020-01-04 LAB — NOVEL CORONAVIRUS, NAA: SARS-CoV-2, NAA: NOT DETECTED

## 2020-01-15 ENCOUNTER — Ambulatory Visit: Payer: Medicare Other

## 2020-07-27 ENCOUNTER — Telehealth: Payer: Self-pay | Admitting: Family Medicine

## 2020-07-27 DIAGNOSIS — R200 Anesthesia of skin: Secondary | ICD-10-CM

## 2020-07-27 DIAGNOSIS — M542 Cervicalgia: Secondary | ICD-10-CM

## 2020-07-27 NOTE — Telephone Encounter (Signed)
Please advise 

## 2020-07-27 NOTE — Telephone Encounter (Signed)
Ordered

## 2020-07-27 NOTE — Telephone Encounter (Signed)
Pt called stating the pain has persisted and he would like to proceed with an MRI.  [phone]

## 2020-07-27 NOTE — Telephone Encounter (Signed)
Left voice mail that the MRI has been ordered. He will be getting a call from the imaging facility to schedule.

## 2020-08-23 ENCOUNTER — Ambulatory Visit
Admission: RE | Admit: 2020-08-23 | Discharge: 2020-08-23 | Disposition: A | Discharge status: Home / Self Care | Payer: Medicare Other | Source: Ambulatory Visit | Attending: Family Medicine | Admitting: Family Medicine

## 2020-08-23 DIAGNOSIS — M542 Cervicalgia: Secondary | ICD-10-CM

## 2020-08-23 IMAGING — MR MR CERVICAL SPINE W/O CM
5 series · 35 of 48 positions shown · non-contrast
Comparison: Cervical spine radiographs [DATE]

CLINICAL DATA: Chronic neck pain.  Left arm pain

EXAM:
MRI CERVICAL SPINE WITHOUT CONTRAST
TECHNIQUE: Multiplanar, multisequence MR imaging of the cervical spine was
performed. No intravenous contrast was administered.

[Series 2: T2 · sagittal · 3.0mm · 0.41mm/px · 8 of 17 slices shown (1 of 2)]
[im 1/17]
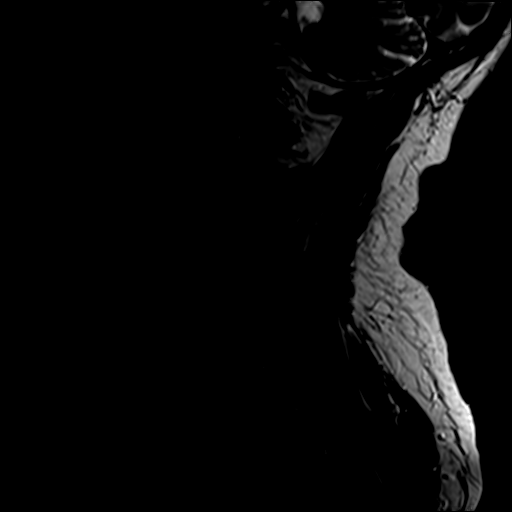
[im 3/17]
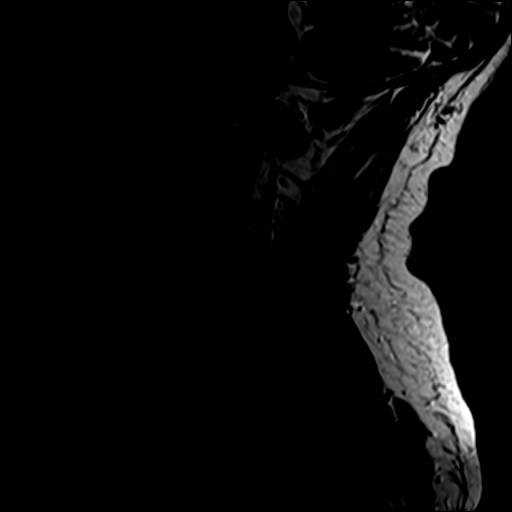
[im 5/17]
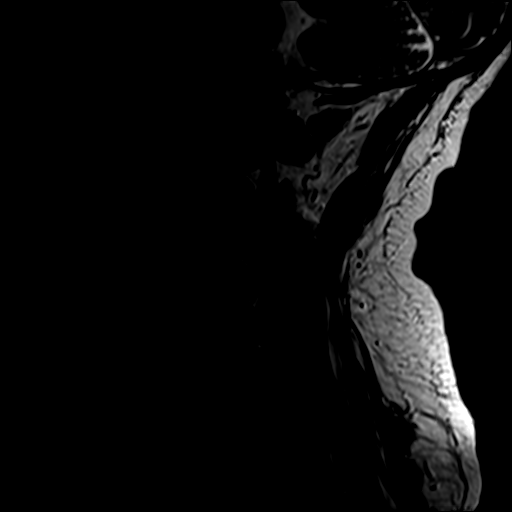
[im 7/17]
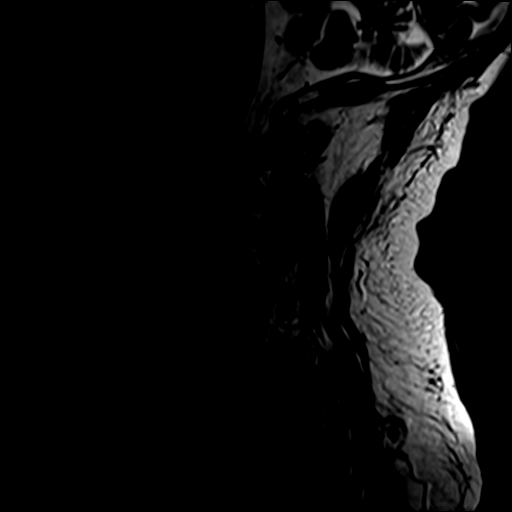
[im 10/17]
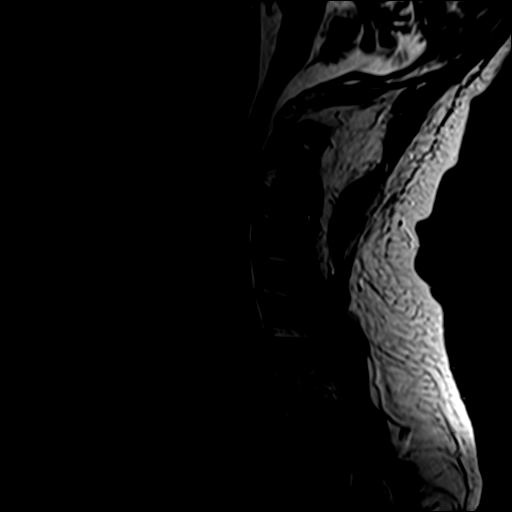
[im 12/17]
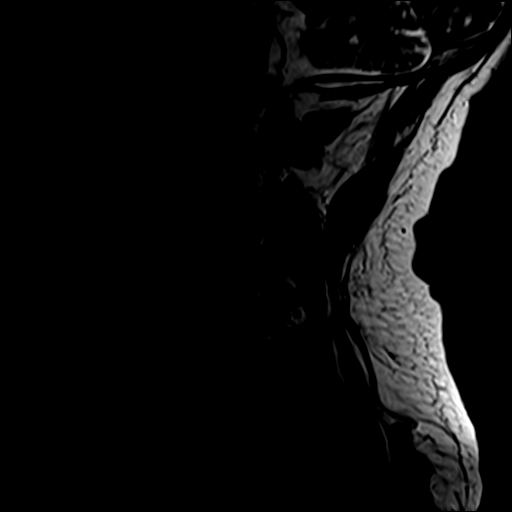
[im 14/17]
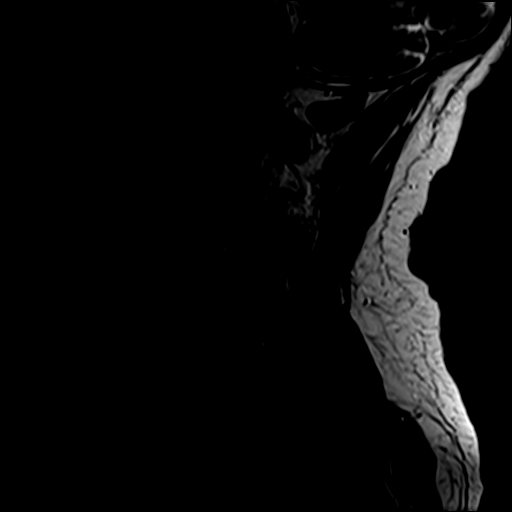
[im 17/17]
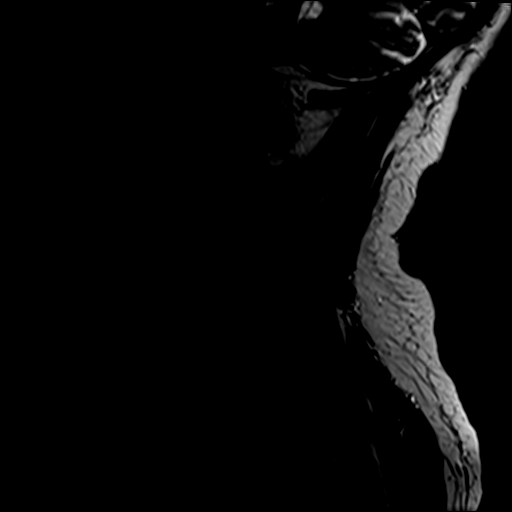

[Series 3: STIR · sagittal · 3.0mm · 0.82mm/px · 7 of 17 slices shown]
[im 1/17]
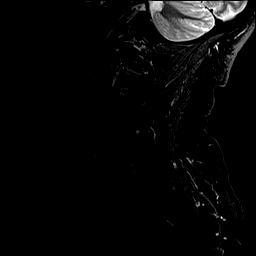
[im 3/17]
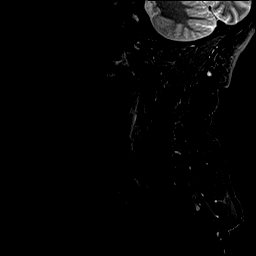
[im 6/17]
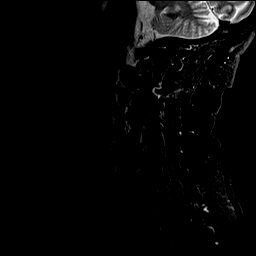
[im 9/17]
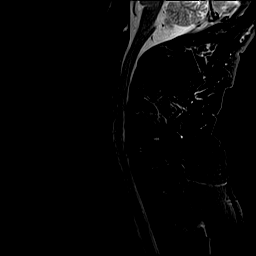
[im 11/17]
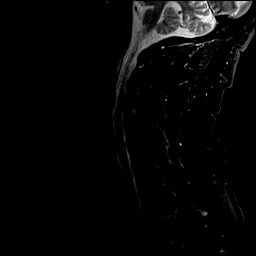
[im 14/17]
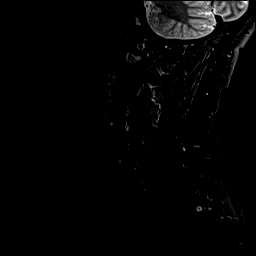
[im 17/17]
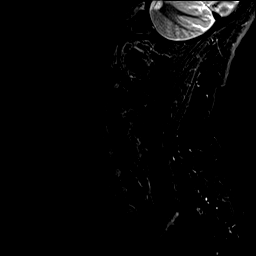

[Series 4: T1 · sagittal · 3.0mm · 0.82mm/px · 7 of 17 slices shown]
[im 1/17]
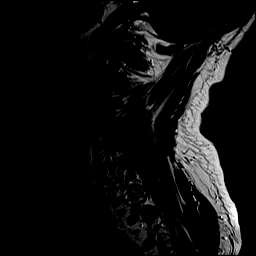
[im 3/17]
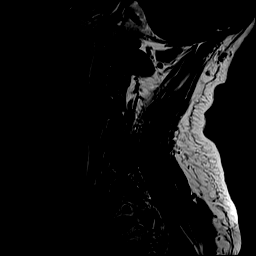
[im 6/17]
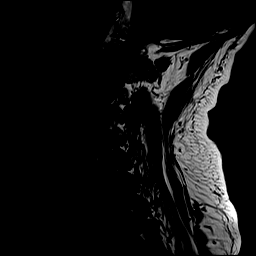
[im 9/17]
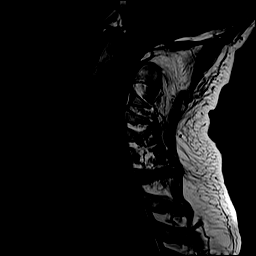
[im 11/17]
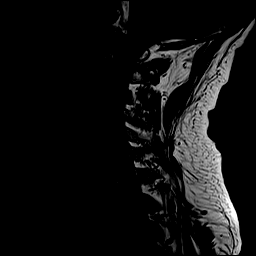
[im 14/17]
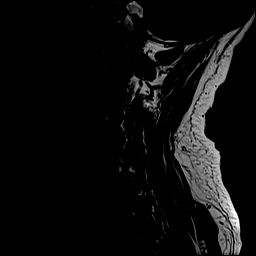
[im 17/17]
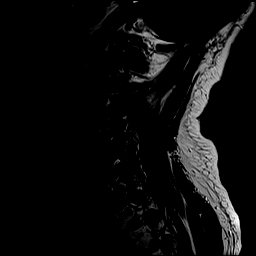

[Series 5: T2 · axial · 3.0mm · 0.70mm/px · z∈[-79,+26]mm · 9 of 30 slices shown (2 of 2)]
[im 1/30]
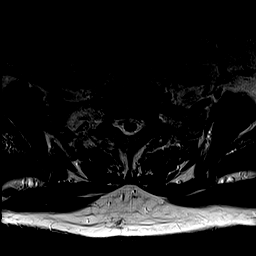
[im 5/30]
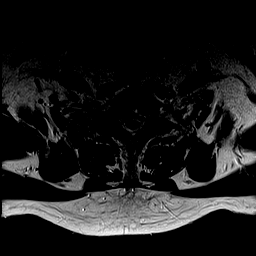
[im 10/30]
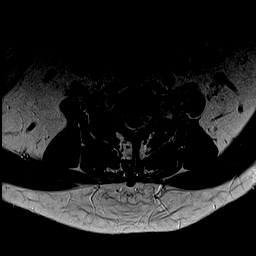
[im 13/30]
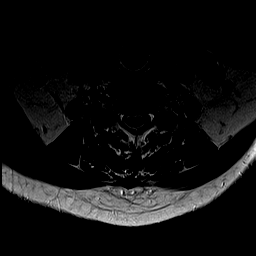
[im 15/30]
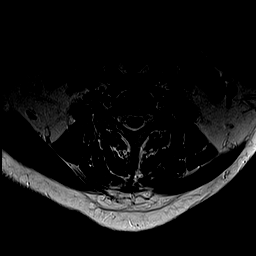
[im 17/30]
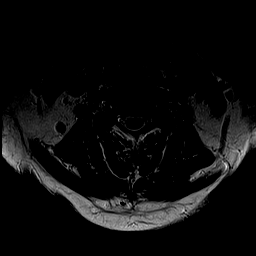
[im 20/30]
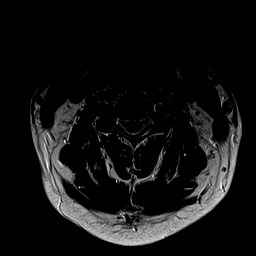
[im 25/30]
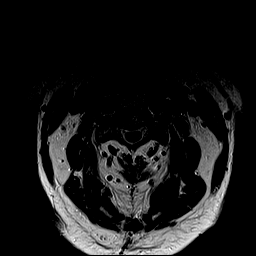
[im 30/30]
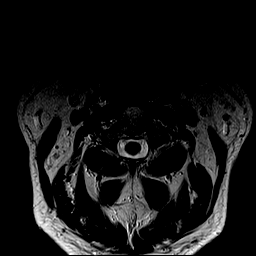

[Series 6: GRE · axial · 3.0mm · 0.35mm/px · z∈[-81,-37]mm · 4 of 31 slices shown]
[im 1/31]
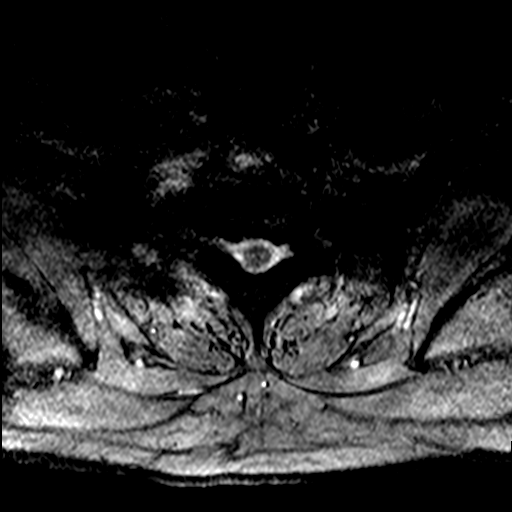
[im 6/31]
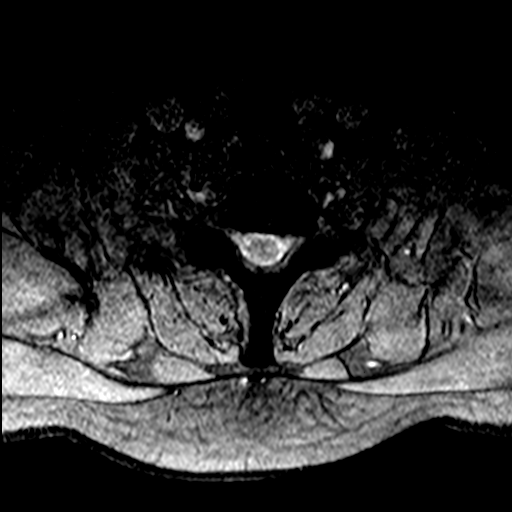
[im 11/31]
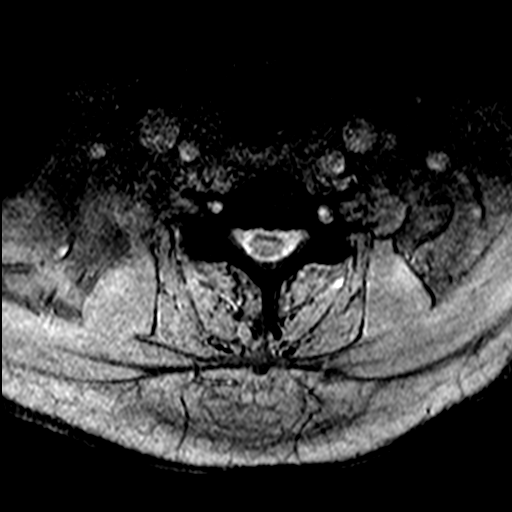
[im 13/31]
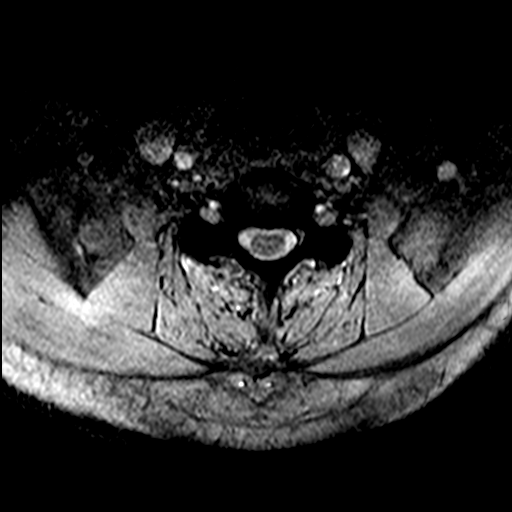

[35 of 48 positions shown; findings below may reference images not displayed]

FINDINGS: Alignment: 2 mm retrolisthesis C3-4. Slight retrolisthesis C6-7 and
C7-T1

Vertebrae: Normal bone marrow.  Negative for fracture or mass.

Cord: Cord evaluation limited by motion. No cord signal abnormality
is detected allowing for motion.

Posterior Fossa, vertebral arteries, paraspinal tissues: Negative

Disc levels:

C2-3: Small central disc protrusion without significant stenosis.

C3-4: Disc degeneration with mild uncinate spurring and mild facet
degeneration. Cord flattening with mild spinal stenosis. Neural
foramina patent bilaterally

C4-5: Small central disc protrusion. Negative for spinal or
foraminal stenosis.

C5-6: Mild disc and mild facet degeneration. Mild foraminal
narrowing on the right. Left foramen patent

C6-7: Small central and left foraminal disc protrusion. Bilateral
facet hypertrophy. Moderate foraminal stenosis bilaterally and mild
spinal stenosis

C7-T1: Marked left foraminal encroachment with impingement of the
left C8 nerve root. There is left foraminal disc protrusion and
osteophyte contributing to stenosis. Moderate right foraminal
stenosis.
IMPRESSION: Multilevel degenerative change throughout the cervical spine as
above

Regarding the patient's left arm pain, there is moderate left
foraminal stenosis at C6-7 and severe left foraminal encroachment at
C7-T1.

## 2020-08-24 ENCOUNTER — Telehealth: Payer: Self-pay | Admitting: Family Medicine

## 2020-08-24 DIAGNOSIS — R202 Paresthesia of skin: Secondary | ICD-10-CM

## 2020-08-24 DIAGNOSIS — R200 Anesthesia of skin: Secondary | ICD-10-CM

## 2020-08-24 DIAGNOSIS — M542 Cervicalgia: Secondary | ICD-10-CM

## 2020-08-24 NOTE — Telephone Encounter (Signed)
MRI shows severe narrowing of the nerve opening on the left at C7-T1 level due to a disc protrusion and bone spur formation.  There is moderate narrowing at the C6-7 level due to similar findings.  I suspect the C7-T1 level is the source of most of the symptoms.

## 2020-08-25 NOTE — Addendum Note (Signed)
Addended by: Hortencia Pilar on: 08/25/2020 09:04 AM   Modules accepted: Orders

## 2020-08-27 ENCOUNTER — Telehealth: Payer: Self-pay | Admitting: Physical Medicine and Rehabilitation

## 2020-08-27 NOTE — Telephone Encounter (Signed)
Patient called. He missed a call from [facility] to schedule an appointment.

## 2020-09-01 ENCOUNTER — Other Ambulatory Visit: Payer: Self-pay

## 2020-09-01 ENCOUNTER — Encounter: Payer: Self-pay | Admitting: Family Medicine

## 2020-09-01 ENCOUNTER — Ambulatory Visit (INDEPENDENT_AMBULATORY_CARE_PROVIDER_SITE_OTHER): Payer: Medicare Other | Admitting: Family Medicine

## 2020-09-01 DIAGNOSIS — R200 Anesthesia of skin: Secondary | ICD-10-CM | POA: Diagnosis not present

## 2020-09-01 DIAGNOSIS — M542 Cervicalgia: Secondary | ICD-10-CM

## 2020-09-01 DIAGNOSIS — R202 Paresthesia of skin: Secondary | ICD-10-CM | POA: Diagnosis not present

## 2020-09-01 MED ORDER — GABAPENTIN 100 MG PO CAPS: ORAL_CAPSULE | ORAL | 3 refills | Status: DC

## 2020-09-01 NOTE — Progress Notes (Signed)
   Office Visit Note   Patient: Steven Sanford           Date of Birth: 1956/04/15           MRN: [id number] Visit Date: 09/01/2020 Requested by: No referring provider defined for this encounter. PCP: Patient, No Pcp Per  Subjective: Chief Complaint  Patient presents with  . Neck - Pain, Follow-up    Finished PT. S/p MRI. Pain is back to keeping him awake at night.    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  He has moderate narrowing at C6-7 but the C7-T1 was the worst.  He did physical therapy which helped for a little bit.  He tried gabapentin which helped for a little bit.  He is continuing to struggle with daily pain and is frustrated by his ongoing symptoms.  We had discussed epidural injection and he would like to do that, he was thinking he was supposed to have that done today as well.              ROS:   All other systems were reviewed and are negative.  Objective: Vital Signs: There were no vitals taken for this visit.  Physical Exam:  General:  Alert and oriented, in no acute distress. Pulm:  Breathing unlabored. Psy:  Normal mood, congruent affect.  Neck: He has a little pain with rotation to the left but Spurling's test is equivocal.  He is tender in the rhomboid region on the left.  Upper extremity strength and reflexes remain normal.  Imaging: No results found.  Assessment & Plan: 1.  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 Ernestina Patches.  Refilled gabapentin.  Surgical consult if he fails to improve.     Procedures: No procedures performed  No notes on file     PMFS History: There are no problems to display for this patient.  Past Medical History:  Diagnosis Date  . Hypertension     History reviewed. No pertinent family history.  History reviewed. No pertinent surgical  history. Social History   Occupational History  . Not on file  Tobacco Use  . Smoking status: Never Smoker  . Smokeless tobacco: Never Used  Substance and Sexual Activity  . Alcohol use: Not on file  . Drug use: Not on file  . Sexual activity: Not on file

## 2020-09-03 NOTE — Telephone Encounter (Signed)
Documented in referral  

## 2020-09-23 ENCOUNTER — Encounter: Payer: Self-pay | Admitting: Physical Medicine and Rehabilitation

## 2020-09-23 ENCOUNTER — Ambulatory Visit: Payer: Self-pay

## 2020-09-23 ENCOUNTER — Other Ambulatory Visit: Payer: Self-pay

## 2020-09-23 ENCOUNTER — Ambulatory Visit (INDEPENDENT_AMBULATORY_CARE_PROVIDER_SITE_OTHER): Payer: Medicare Other | Admitting: Physical Medicine and Rehabilitation

## 2020-09-23 VITALS — BP 129/84 | HR 97

## 2020-09-23 DIAGNOSIS — M4802 Spinal stenosis, cervical region: Secondary | ICD-10-CM

## 2020-09-23 DIAGNOSIS — M5412 Radiculopathy, cervical region: Secondary | ICD-10-CM

## 2020-09-23 DIAGNOSIS — M7918 Myalgia, other site: Secondary | ICD-10-CM

## 2020-09-23 MED ORDER — METHYLPREDNISOLONE ACETATE 80 MG/ML IJ SUSP
80.0000 mg | Freq: Once | INTRAMUSCULAR | Status: AC
  Administered 2020-09-23: 80 mg

## 2020-09-23 NOTE — Progress Notes (Signed)
Pt state under left arm and shoulder pain. Pt state any moment or any lifting makes the pain worse. Pt state hot/cold shower and pain meds helps ease the pain.  Numeric Pain Rating Scale and Functional Assessment Average Pain 2   In the last MONTH (on 0-10 scale) has pain interfered with the following?  1. General activity like being  able to carry out your everyday physical activities such as walking, climbing stairs, carrying groceries, or moving a chair?  Rating(9)   +Driver, -BT, -Dye Allergies.

## 2020-09-23 NOTE — Procedures (Signed)
Cervical Epidural Steroid Injection - Interlaminar Approach with Fluoroscopic Guidance  Patient: Steven Sanford      Date of Birth: June 13, 1956 MRN: [id number] PCP: Patient, No Pcp Per      Visit Date: 09/23/2020   Universal Protocol:    Date/Time: 09/29/211:06 PM  Consent Given By: the patient  Position: PRONE  Additional Comments: Vital signs were monitored before and after the procedure. Patient was prepped and draped in the usual sterile fashion. The correct patient, procedure, and site was verified.   Injection Procedure Details:  Procedure Site One Meds Administered:  Meds ordered this encounter  Medications  . methylPREDNISolone acetate (DEPO-MEDROL) injection 80 mg     Laterality: Left  Location/Site: C7-T1  Needle size: 20 G  Needle type: Touhy  Needle Placement: Paramedian epidural space  Findings:  -Comments: Excellent flow of contrast into the epidural space.  Procedure Details: Using a paramedian approach from the side mentioned above, the region overlying the inferior lamina was localized under fluoroscopic visualization and the soft tissues overlying this structure were infiltrated with 4 ml. of 1% Lidocaine without Epinephrine. A # 20 gauge, Tuohy needle was inserted into the epidural space using a paramedian approach.  The epidural space was localized using loss of resistance along with lateral and contralateral oblique bi-planar fluoroscopic views.  After negative aspirate for air, blood, and CSF, a 2 ml. volume of Isovue-250 was injected into the epidural space and the flow of contrast was observed. Radiographs were obtained for documentation purposes.   The injectate was administered into the level noted above.  Additional Comments:  The patient tolerated the procedure well Dressing: 2 x 2 sterile gauze and Band-Aid    Post-procedure details: Patient was observed during the procedure. Post-procedure instructions were reviewed.  Patient left  the clinic in stable condition.

## 2020-09-23 NOTE — Progress Notes (Signed)
Steven Sanford - 64 y.o. male MRN [id number]  Date of birth: [birth year]  Office Visit Note: Visit Date: 09/23/2020 PCP: Patient, No Pcp Per Referred by: Eunice Blase, MD  Subjective: Chief Complaint  Patient presents with  . Left Shoulder - Pain  . Left Arm - Pain   HPI:  Steven Sanford is a 64 y.o. male who comes in today at the request of Dr. Eunice Blase for planned Left C7-T1 Cervical epidural steroid injection with fluoroscopic guidance.  The patient has failed conservative care including home exercise, medications, time and activity modification.  This injection will be diagnostic and hopefully therapeutic.  Please see requesting physician notes for further details and justification.  MRI reviewed with images and spine model.  MRI reviewed in the note below.   ROS Otherwise per HPI.  Assessment & Plan: Visit Diagnoses:  1. Cervical radiculopathy   2. Foraminal stenosis of cervical region   3. Myofascial pain syndrome     Plan: No additional findings.   Meds & Orders:  Meds ordered this encounter  Medications  . methylPREDNISolone acetate (DEPO-MEDROL) injection 80 mg    Orders Placed This Encounter  Procedures  . XR C-ARM NO REPORT  . Epidural Steroid injection    Follow-up: Return for visit to requesting physician as needed.   Procedures: No procedures performed  Cervical Epidural Steroid Injection - Interlaminar Approach with Fluoroscopic Guidance  Patient: Steven Sanford      Date of Birth: [birth year] MRN: [id number] PCP: Patient, No Pcp Per      Visit Date: 09/23/2020   Universal Protocol:    Date/Time: 09/29/211:06 PM  Consent Given By: the patient  Position: PRONE  Additional Comments: Vital signs were monitored before and after the procedure. Patient was prepped and draped in the usual sterile fashion. The correct patient, procedure, and site was verified.   Injection Procedure Details:  Procedure Site One Meds Administered:  Meds  ordered this encounter  Medications  . methylPREDNISolone acetate (DEPO-MEDROL) injection 80 mg     Laterality: Left  Location/Site: C7-T1  Needle size: 20 G  Needle type: Touhy  Needle Placement: Paramedian epidural space  Findings:  -Comments: Excellent flow of contrast into the epidural space.  Procedure Details: Using a paramedian approach from the side mentioned above, the region overlying the inferior lamina was localized under fluoroscopic visualization and the soft tissues overlying this structure were infiltrated with 4 ml. of 1% Lidocaine without Epinephrine. A # 20 gauge, Tuohy needle was inserted into the epidural space using a paramedian approach.  The epidural space was localized using loss of resistance along with lateral and contralateral oblique bi-planar fluoroscopic views.  After negative aspirate for air, blood, and CSF, a 2 ml. volume of Isovue-250 was injected into the epidural space and the flow of contrast was observed. Radiographs were obtained for documentation purposes.   The injectate was administered into the level noted above.  Additional Comments:  The patient tolerated the procedure well Dressing: 2 x 2 sterile gauze and Band-Aid    Post-procedure details: Patient was observed during the procedure. Post-procedure instructions were reviewed.  Patient left the clinic in stable condition.     Clinical History: MRI CERVICAL SPINE WITHOUT CONTRAST  TECHNIQUE: Multiplanar, multisequence MR imaging of the cervical spine was performed. No intravenous contrast was administered.  COMPARISON:  Cervical spine radiographs 12/11/2019  FINDINGS: Alignment: 2 mm retrolisthesis C3-4. Slight retrolisthesis C6-7 and C7-T1  Vertebrae: Normal bone marrow.  Negative for fracture or mass.  Cord: Cord evaluation limited by motion. No cord signal abnormality is detected allowing for motion.  Posterior Fossa, vertebral arteries, paraspinal tissues:  Negative  Disc levels:  C2-3: Small central disc protrusion without significant stenosis.  C3-4: Disc degeneration with mild uncinate spurring and mild facet degeneration. Cord flattening with mild spinal stenosis. Neural foramina patent bilaterally  C4-5: Small central disc protrusion. Negative for spinal or foraminal stenosis.  C5-6: Mild disc and mild facet degeneration. Mild foraminal narrowing on the right. Left foramen patent  C6-7: Small central and left foraminal disc protrusion. Bilateral facet hypertrophy. Moderate foraminal stenosis bilaterally and mild spinal stenosis  C7-T1: Marked left foraminal encroachment with impingement of the left C8 nerve root. There is left foraminal disc protrusion and osteophyte contributing to stenosis. Moderate right foraminal stenosis.  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   Electronically Signed   By: Franchot Gallo M.D.   On: 08/24/2020 10:30     Objective:  VS:  HT:    WT:   BMI:     BP:129/84  HR:97bpm  TEMP: ( )  RESP:  Physical Exam Vitals and nursing note reviewed.  Constitutional:      General: He is not in acute distress.    Appearance: Normal appearance. He is not ill-appearing.  HENT:     Head: Normocephalic and atraumatic.     Right Ear: External ear normal.     Left Ear: External ear normal.  Eyes:     Extraocular Movements: Extraocular movements intact.  Cardiovascular:     Rate and Rhythm: Normal rate.     Pulses: Normal pulses.  Abdominal:     General: There is no distension.     Palpations: Abdomen is soft.  Musculoskeletal:        General: No signs of injury.     Cervical back: Neck supple. Tenderness present. No rigidity.     Right lower leg: No edema.     Left lower leg: No edema.     Comments:  Patient has good strength in the upper extremities with 5 out of 5 strength in wrist extension long finger flexion APB.  No intrinsic hand muscle atrophy.  Negative Hoffmann's test.  Lymphadenopathy:     Cervical: No cervical adenopathy.  Skin:    Findings: No erythema or rash.  Neurological:     General: No focal deficit present.     Mental Status: He is alert and oriented to person, place, and time.     Sensory: No sensory deficit.     Motor: No weakness or abnormal muscle tone.     Coordination: Coordination normal.  Psychiatric:        Mood and Affect: Mood normal.        Behavior: Behavior normal.      Imaging: No results found.

## 2020-11-13 ENCOUNTER — Telehealth: Payer: Self-pay

## 2020-11-13 NOTE — Telephone Encounter (Signed)
Patient called in wanting to get another injection , said  Dr Ernestina Patches advised him to wait a month from his last injection

## 2020-11-13 NOTE — Telephone Encounter (Signed)
Left C7-T1 IL on 9/29. Ok to repeat if helped, same problem/side, and no new injury?

## 2020-11-13 NOTE — Telephone Encounter (Signed)
Yes, if helped at all try one more.

## 2020-11-13 NOTE — Telephone Encounter (Signed)
Patient reports that first injection did help. Scheduled for 12/1 at 1345 with driver and no blood thinners.

## 2020-11-25 ENCOUNTER — Ambulatory Visit: Payer: Self-pay

## 2020-11-25 ENCOUNTER — Other Ambulatory Visit: Payer: Self-pay

## 2020-11-25 ENCOUNTER — Encounter: Payer: Self-pay | Admitting: Physical Medicine and Rehabilitation

## 2020-11-25 ENCOUNTER — Ambulatory Visit: Payer: Medicare Other | Admitting: Physical Medicine and Rehabilitation

## 2020-11-25 VITALS — BP 140/82 | HR 101

## 2020-11-25 DIAGNOSIS — M5412 Radiculopathy, cervical region: Secondary | ICD-10-CM

## 2020-11-25 MED ORDER — BETAMETHASONE SOD PHOS & ACET 6 (3-3) MG/ML IJ SUSP
12.0000 mg | Freq: Once | INTRAMUSCULAR | Status: AC
  Administered 2020-11-25: 12 mg

## 2020-11-25 NOTE — Progress Notes (Signed)
Pt state neck pain that travels down his left arm to his digit 4 and 5. Pt state when he is lifting boxes the pain gets worse. Pt state taking hot and cold showers helps ease the pain. Pt has hx of inj on 09/23/20 pt state he gotten improvement but pain return two weeks later.  Numeric Pain Rating Scale and Functional Assessment Average Pain 4   In the last MONTH (on 0-10 scale) has pain interfered with the following?  1. General activity like being  able to carry out your everyday physical activities such as walking, climbing stairs, carrying groceries, or moving a chair?  Rating(4)   +Driver, -BT, -Dye Allergies.

## 2020-12-26 HISTORY — PX: EYE SURGERY: SHX253

## 2021-01-31 NOTE — Progress Notes (Signed)
Steven Sanford - 65 y.o. male MRN [id number]  Date of birth: September 21, 1956  Office Visit Note: Visit Date: 11/25/2020 PCP: Patient, No Pcp Per Referred by: No ref. provider found  Subjective: Chief Complaint  Patient presents with  . Neck - Pain  . Left Arm - Pain  . Left Hand - Pain   HPI:  Steven Sanford is a 65 y.o. male who comes in today for planned repeat Left C7-T1 Cervical epidural steroid injection with fluoroscopic guidance.  The patient has failed conservative care including home exercise, medications, time and activity modification.  This injection will be diagnostic and hopefully therapeutic.  Please see requesting physician notes for further details and justification. Patient received more than 50% pain relief from prior injection.   Referring: Dr. Legrand Como Hilts    ROS Otherwise per HPI.  Assessment & Plan: Visit Diagnoses:    ICD-10-CM   1. Cervical radiculopathy  M54.12 XR C-ARM NO REPORT    Epidural Steroid injection    betamethasone acetate-betamethasone sodium phosphate (CELESTONE) injection 12 mg    Plan: No additional findings.   Meds & Orders:  Meds ordered this encounter  Medications  . betamethasone acetate-betamethasone sodium phosphate (CELESTONE) injection 12 mg    Orders Placed This Encounter  Procedures  . XR C-ARM NO REPORT  . Epidural Steroid injection    Follow-up: Return if symptoms worsen or fail to improve.   Procedures: No procedures performed  Cervical Epidural Steroid Injection - Interlaminar Approach with Fluoroscopic Guidance  Patient: Steven Sanford      Date of Birth: Feb 03, 1956 MRN: [id number] PCP: Patient, No Pcp Per      Visit Date: 11/25/2020   Universal Protocol:    Date/Time: 02/06/221:07 PM  Consent Given By: the patient  Position: PRONE  Additional Comments: Vital signs were monitored before and after the procedure. Patient was prepped and draped in the usual sterile fashion. The correct patient, procedure,  and site was verified.   Injection Procedure Details:   Procedure diagnoses: Cervical radiculopathy [M54.12]    Meds Administered:  Meds ordered this encounter  Medications  . betamethasone acetate-betamethasone sodium phosphate (CELESTONE) injection 12 mg     Laterality: Left  Location/Site: C7-T1  Needle: 3.5 in., 20 ga. Tuohy  Needle Placement: Paramedian epidural space  Findings:  -Comments: Excellent flow of contrast into the epidural space.  Procedure Details: Using a paramedian approach from the side mentioned above, the region overlying the inferior lamina was localized under fluoroscopic visualization and the soft tissues overlying this structure were infiltrated with 4 ml. of 1% Lidocaine without Epinephrine. A # 20 gauge, Tuohy needle was inserted into the epidural space using a paramedian approach.  The epidural space was localized using loss of resistance along with contralateral oblique bi-planar fluoroscopic views.  After negative aspirate for air, blood, and CSF, a 2 ml. volume of Isovue-250 was injected into the epidural space and the flow of contrast was observed. Radiographs were obtained for documentation purposes.   The injectate was administered into the level noted above.  Additional Comments:  The patient tolerated the procedure well Dressing: 2 x 2 sterile gauze and Band-Aid    Post-procedure details: Patient was observed during the procedure. Post-procedure instructions were reviewed.  Patient left the clinic in stable condition.     Clinical History: MRI CERVICAL SPINE WITHOUT CONTRAST  TECHNIQUE: Multiplanar, multisequence MR imaging of the cervical spine was performed. No intravenous contrast was administered.  COMPARISON:  Cervical spine radiographs 12/11/2019  FINDINGS:  Alignment: 2 mm retrolisthesis C3-4. Slight retrolisthesis C6-7 and C7-T1  Vertebrae: Normal bone marrow.  Negative for fracture or mass.  Cord: Cord  evaluation limited by motion. No cord signal abnormality is detected allowing for motion.  Posterior Fossa, vertebral arteries, paraspinal tissues: Negative  Disc levels:  C2-3: Small central disc protrusion without significant stenosis.  C3-4: Disc degeneration with mild uncinate spurring and mild facet degeneration. Cord flattening with mild spinal stenosis. Neural foramina patent bilaterally  C4-5: Small central disc protrusion. Negative for spinal or foraminal stenosis.  C5-6: Mild disc and mild facet degeneration. Mild foraminal narrowing on the right. Left foramen patent  C6-7: Small central and left foraminal disc protrusion. Bilateral facet hypertrophy. Moderate foraminal stenosis bilaterally and mild spinal stenosis  C7-T1: Marked left foraminal encroachment with impingement of the left C8 nerve root. There is left foraminal disc protrusion and osteophyte contributing to stenosis. Moderate right foraminal stenosis.  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   Electronically Signed   By: Franchot Gallo M.D.   On: 08/24/2020 10:30     Objective:  VS:  HT:    WT:   BMI:     BP:140/82  HR:(!) 101bpm  TEMP: ( )  RESP:  Physical Exam Vitals and nursing note reviewed.  Constitutional:      General: He is not in acute distress.    Appearance: Normal appearance. He is not ill-appearing.  HENT:     Head: Normocephalic and atraumatic.     Right Ear: External ear normal.     Left Ear: External ear normal.  Eyes:     Extraocular Movements: Extraocular movements intact.  Cardiovascular:     Rate and Rhythm: Normal rate.     Pulses: Normal pulses.  Abdominal:     General: There is no distension.     Palpations: Abdomen is soft.  Musculoskeletal:        General: No  signs of injury.     Cervical back: Neck supple. Tenderness present. No rigidity.     Right lower leg: No edema.     Left lower leg: No edema.     Comments: Patient has good strength in the upper extremities with 5 out of 5 strength in wrist extension long finger flexion APB.  No intrinsic hand muscle atrophy.  Negative Hoffmann's test.  Lymphadenopathy:     Cervical: No cervical adenopathy.  Skin:    Findings: No erythema or rash.  Neurological:     General: No focal deficit present.     Mental Status: He is alert and oriented to person, place, and time.     Sensory: No sensory deficit.     Motor: No weakness or abnormal muscle tone.     Coordination: Coordination normal.  Psychiatric:        Mood and Affect: Mood normal.        Behavior: Behavior normal.      Imaging: No results found.

## 2021-01-31 NOTE — Procedures (Signed)
Cervical Epidural Steroid Injection - Interlaminar Approach with Fluoroscopic Guidance  Patient: Steven Sanford      Date of Birth: 05-04-1956 MRN: [id number] PCP: Patient, No Pcp Per      Visit Date: 11/25/2020   Universal Protocol:    Date/Time: 02/06/221:07 PM  Consent Given By: the patient  Position: PRONE  Additional Comments: Vital signs were monitored before and after the procedure. Patient was prepped and draped in the usual sterile fashion. The correct patient, procedure, and site was verified.   Injection Procedure Details:   Procedure diagnoses: Cervical radiculopathy [M54.12]    Meds Administered:  Meds ordered this encounter  Medications  . betamethasone acetate-betamethasone sodium phosphate (CELESTONE) injection 12 mg     Laterality: Left  Location/Site: C7-T1  Needle: 3.5 in., 20 ga. Tuohy  Needle Placement: Paramedian epidural space  Findings:  -Comments: Excellent flow of contrast into the epidural space.  Procedure Details: Using a paramedian approach from the side mentioned above, the region overlying the inferior lamina was localized under fluoroscopic visualization and the soft tissues overlying this structure were infiltrated with 4 ml. of 1% Lidocaine without Epinephrine. A # 20 gauge, Tuohy needle was inserted into the epidural space using a paramedian approach.  The epidural space was localized using loss of resistance along with contralateral oblique bi-planar fluoroscopic views.  After negative aspirate for air, blood, and CSF, a 2 ml. volume of Isovue-250 was injected into the epidural space and the flow of contrast was observed. Radiographs were obtained for documentation purposes.   The injectate was administered into the level noted above.  Additional Comments:  The patient tolerated the procedure well Dressing: 2 x 2 sterile gauze and Band-Aid    Post-procedure details: Patient was observed during the procedure. Post-procedure  instructions were reviewed.  Patient left the clinic in stable condition.

## 2021-03-25 ENCOUNTER — Encounter: Payer: Self-pay | Admitting: Family Medicine

## 2021-03-25 ENCOUNTER — Other Ambulatory Visit: Payer: Self-pay

## 2021-03-25 ENCOUNTER — Ambulatory Visit (INDEPENDENT_AMBULATORY_CARE_PROVIDER_SITE_OTHER): Payer: Medicare Other | Admitting: Family Medicine

## 2021-03-25 DIAGNOSIS — R200 Anesthesia of skin: Secondary | ICD-10-CM | POA: Diagnosis not present

## 2021-03-25 DIAGNOSIS — R202 Paresthesia of skin: Secondary | ICD-10-CM

## 2021-03-25 DIAGNOSIS — M542 Cervicalgia: Secondary | ICD-10-CM | POA: Diagnosis not present

## 2021-03-25 DIAGNOSIS — M545 Low back pain, unspecified: Secondary | ICD-10-CM

## 2021-03-25 NOTE — Progress Notes (Signed)
Office Visit Note   Patient: Steven Sanford           Date of Birth: Feb 18, 1956           MRN: [id number] Visit Date: 03/25/2021 Requested by: No referring provider defined for this encounter. PCP: Patient, No Pcp Per (Inactive)  Subjective: Chief Complaint  Patient presents with  . Neck - Pain, Follow-up    Having much pain in the upper back over the past week - hurts to even put his socks and shoes on. He has been through PT and wonders if he may need more vs something else.  . Left Foot - Pain    Pain in the ball of both feet, with numbness -- started in the left foot around 2 years ago (was mild at first). He is starting to feel numbness into the toes. No issues with balance at this time.  . Right Foot - Pain    HPI: He is here for follow-up chronic neck pain.  MRI scan showed C6-7 and C7-T1 disc protrusions with stenosis.  He has had 2 epidural injections with temporary relief.  He went to physical therapy which gave him temporary relief.  He tried gabapentin which did not make that much difference for him.  He is frustrated by his ongoing symptoms.  Recently his left lower back has been bothering him.  No radicular symptoms, but pain when he bends forward.  He also states that he has had chronic numbness in both feet, starting on the left and now involving the right.  Is been a few years now and symptoms are getting worse.  No history of diabetes.  He has had blood work-up a few years ago but nothing was found.  He has been on a statin for 4 years, about a year prior to the onset of his symptoms.                ROS:   All other systems were reviewed and are negative.  Objective: Vital Signs: There were no vitals taken for this visit.  Physical Exam:  General:  Alert and oriented, in no acute distress. Pulm:  Breathing unlabored. Psy:  Normal mood, congruent affect. Skin: No rash  neck: Upper extremity strength and reflexes are still normal.  Tender in the musculature left  scapular area. Low back: Tender in the musculature near the left SI joint.  Imaging: No results found.  Assessment & Plan: 1.  Ongoing neck pain with C6-7 and C7-T1 disc protrusions and stenosis -Trial of physical therapy at [facility].  If he fails to improve, consider surgical consult.  2.  Acute low back pain -Trial of physical therapy.  3.  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  If work-up is unrevealing, consider stopping statin.     Procedures: No procedures performed        PMFS History: Patient Active Problem List   Diagnosis Date Noted  . Pre-operative cardiovascular examination 01/15/2018  . Pure hypercholesterolemia, unspecified 04/17/2015  . Family history of ischemic heart disease and other diseases of the circulatory system 03/05/2015  . Essential (primary) hypertension 02/26/2015   Past Medical History:  Diagnosis Date  . Hypertension     History reviewed. No pertinent family history.  History reviewed. No pertinent surgical history. Social History   Occupational History  . Not on file  Tobacco Use  . Smoking status: Never Smoker  . Smokeless tobacco: Never Used  Substance  and Sexual Activity  . Alcohol use: Not on file  . Drug use: Not on file  . Sexual activity: Not on file

## 2021-03-26 ENCOUNTER — Telehealth: Payer: Self-pay | Admitting: Family Medicine

## 2021-03-26 LAB — B. BURGDORFI ANTIBODIES: B burgdorferi Ab IgG+IgM: <0.9 index

## 2021-03-26 NOTE — Telephone Encounter (Signed)
Lyme titers are negative/normal.

## 2021-03-29 ENCOUNTER — Telehealth: Payer: Self-pay | Admitting: Family Medicine

## 2021-03-29 LAB — COMPREHENSIVE METABOLIC PANEL
AG Ratio: 1.7 (calc) (ref 1.0–2.5)
ALT: 29 U/L (ref 9–46)
AST: 21 U/L (ref 10–35)
Albumin: 4.7 g/dL (ref 3.6–5.1)
Alkaline phosphatase (APISO): 55 U/L (ref 35–144)
BUN: 16 mg/dL (ref 7–25)
CO2: 27 mmol/L (ref 20–32)
Calcium: 9.5 mg/dL (ref 8.6–10.3)
Chloride: 102 mmol/L (ref 98–110)
Creat: 1.06 mg/dL (ref 0.70–1.25)
Globulin: 2.8 g/dL (calc) (ref 1.9–3.7)
Glucose, Bld: 91 mg/dL (ref 65–99)
Potassium: 4.4 mmol/L (ref 3.5–5.3)
Sodium: 139 mmol/L (ref 135–146)
Total Bilirubin: 0.7 mg/dL (ref 0.2–1.2)
Total Protein: 7.5 g/dL (ref 6.1–8.1)

## 2021-03-29 LAB — CBC WITH DIFFERENTIAL/PLATELET
Absolute Monocytes: 444 cells/uL (ref 200–950)
Basophils Absolute: 48 cells/uL (ref 0–200)
Basophils Relative: 0.8 %
Eosinophils Absolute: 150 cells/uL (ref 15–500)
Eosinophils Relative: 2.5 %
HCT: 46.8 % (ref 38.5–50.0)
Hemoglobin: 15.2 g/dL (ref 13.2–17.1)
Lymphs Abs: 2046 cells/uL (ref 850–3900)
MCH: 30.1 pg (ref 27.0–33.0)
MCHC: 32.5 g/dL (ref 32.0–36.0)
MCV: 92.7 fL (ref 80.0–100.0)
MPV: 8.9 fL (ref 7.5–12.5)
Monocytes Relative: 7.4 %
Neutro Abs: 3312 cells/uL (ref 1500–7800)
Neutrophils Relative %: 55.2 %
Platelets: 275 10*3/uL (ref 140–400)
RBC: 5.05 10*6/uL (ref 4.20–5.80)
RDW: 12.6 % (ref 11.0–15.0)
Total Lymphocyte: 34.1 %
WBC: 6 10*3/uL (ref 3.8–10.8)

## 2021-03-29 LAB — PROTEIN ELECTROPHORESIS, SERUM
Albumin ELP: 4.3 g/dL (ref 3.8–4.8)
Alpha 1: 0.3 g/dL (ref 0.2–0.3)
Alpha 2: 0.8 g/dL (ref 0.5–0.9)
Beta 2: 0.3 g/dL (ref 0.2–0.5)
Beta Globulin: 0.5 g/dL (ref 0.4–0.6)
Gamma Globulin: 1.1 g/dL (ref 0.8–1.7)
Total Protein: 7.2 g/dL (ref 6.1–8.1)

## 2021-03-29 LAB — ANA: Anti Nuclear Antibody (ANA): NEGATIVE

## 2021-03-29 LAB — THYROID PANEL WITH TSH
Free Thyroxine Index: 2.4 (ref 1.4–3.8)
T3 Uptake: 33 % (ref 22–35)
T4, Total: 7.3 ug/dL (ref 4.9–10.5)
TSH: 1.45 mIU/L (ref 0.40–4.50)

## 2021-03-29 LAB — IRON,TIBC AND FERRITIN PANEL
%SAT: 47 % (calc) (ref 20–48)
Ferritin: 221 ng/mL (ref 24–380)
Iron: 171 ug/dL (ref 50–180)
TIBC: 362 mcg/dL (calc) (ref 250–425)

## 2021-03-29 LAB — SEDIMENTATION RATE: Sed Rate: 6 mm/h (ref 0–20)

## 2021-03-29 LAB — VITAMIN D 25 HYDROXY (VIT D DEFICIENCY, FRACTURES): Vit D, 25-Hydroxy: 24 ng/mL — ABNORMAL LOW (ref 30–100)

## 2021-03-29 LAB — RHEUMATOID FACTOR: Rheumatoid fact SerPl-aCnc: <14 IU/mL (ref <14)

## 2021-03-29 LAB — VITAMIN B12: Vitamin B-12: 317 pg/mL (ref 200–1100)

## 2021-03-29 LAB — LIPID PANEL
Cholesterol: 158 mg/dL (ref <200)
HDL: 51 mg/dL (ref >=40)
LDL Cholesterol (Calc): 83 mg/dL (calc)
Non-HDL Cholesterol (Calc): 107 mg/dL (calc) (ref <130)
Total CHOL/HDL Ratio: 3.1 (calc) (ref <5.0)
Triglycerides: 139 mg/dL (ref <150)

## 2021-03-29 LAB — HEMOGLOBIN A1C
Hgb A1c MFr Bld: 5.4 % of total Hgb (ref <5.7)
Mean Plasma Glucose: 108 mg/dL
eAG (mmol/L): 6 mmol/L

## 2021-03-29 NOTE — Telephone Encounter (Signed)
Labs are notable for the following:  Lyme studies are still pending.  Vitamin D is low at 24.  We want this to be 50-80.  I recommend taking vitamin D3 at 5000 IU daily.  Recheck in about 6 months.  Vitamin B12 level is slightly low at 317.  I recommend taking vitamin B12 at 1000 mcg daily or every other day.  I doubt that this is the primary cause of the foot numbness, but you might feel better taking a supplement.  All else looks good.

## 2021-04-07 ENCOUNTER — Telehealth: Payer: Self-pay | Admitting: Family Medicine

## 2021-04-07 NOTE — Telephone Encounter (Signed)
Left voice mail asking patient to call back or send a message through [place]. Is he needing a new referral for PT and NCS?

## 2021-04-07 NOTE — Telephone Encounter (Signed)
Patient called requesting a call back about a referral for physical therapy and nerve study testing. Please call patient at [phone].

## 2021-04-12 NOTE — Telephone Encounter (Signed)
I sent a message through [place], asking for more detail on what he is needing. Will await that response.

## 2021-04-14 ENCOUNTER — Other Ambulatory Visit: Payer: Self-pay

## 2021-04-14 DIAGNOSIS — M545 Low back pain, unspecified: Secondary | ICD-10-CM

## 2021-04-14 DIAGNOSIS — R202 Paresthesia of skin: Secondary | ICD-10-CM

## 2021-04-14 DIAGNOSIS — M542 Cervicalgia: Secondary | ICD-10-CM

## 2021-04-14 DIAGNOSIS — R200 Anesthesia of skin: Secondary | ICD-10-CM

## 2021-04-21 ENCOUNTER — Encounter: Payer: Self-pay | Admitting: Family Medicine

## 2021-04-21 ENCOUNTER — Telehealth: Payer: Self-pay | Admitting: Family Medicine

## 2021-04-21 NOTE — Telephone Encounter (Signed)
Called pt no answer and LM

## 2021-05-03 ENCOUNTER — Encounter: Payer: Self-pay | Admitting: *Deleted

## 2021-06-15 ENCOUNTER — Telehealth: Payer: Self-pay | Admitting: Family Medicine

## 2021-06-15 NOTE — Telephone Encounter (Signed)
Patient came by. Would like to know the results of his test. Would like Dr. Junius Roads to call him. [phone]

## 2021-06-15 NOTE — Telephone Encounter (Signed)
I called the patient - we have not received the results yet from [facility]. I will hold this message as reminder to keep a lookout for that report.

## 2021-06-17 NOTE — Telephone Encounter (Signed)
I called Dr. Lauris Poag office -- should be receiving the report by fax this morning.

## 2021-06-17 NOTE — Telephone Encounter (Signed)
Results came and have been sent to Dr. Junius Roads for review.

## 2021-06-22 ENCOUNTER — Telehealth: Payer: Self-pay

## 2021-06-22 NOTE — Telephone Encounter (Signed)
Pt called and would like a CB from Dr. Junius Roads regarding his test results from [facility]

## 2021-06-23 ENCOUNTER — Encounter: Payer: Self-pay | Admitting: Family Medicine

## 2021-06-23 DIAGNOSIS — R200 Anesthesia of skin: Secondary | ICD-10-CM

## 2021-06-23 DIAGNOSIS — R202 Paresthesia of skin: Secondary | ICD-10-CM

## 2021-06-23 NOTE — Telephone Encounter (Signed)
It's in the chart. Media tab 6/16 [facility] document. It was sent to him for review.

## 2021-06-25 NOTE — Addendum Note (Signed)
Addended by: Hortencia Pilar on: 06/25/2021 09:22 AM   Modules accepted: Orders

## 2021-07-07 ENCOUNTER — Ambulatory Visit
Admission: RE | Admit: 2021-07-07 | Discharge: 2021-07-07 | Disposition: A | Discharge status: Home / Self Care | Payer: Medicare Other | Source: Ambulatory Visit | Attending: Family Medicine | Admitting: Family Medicine

## 2021-07-07 ENCOUNTER — Other Ambulatory Visit: Payer: Self-pay

## 2021-07-07 DIAGNOSIS — R202 Paresthesia of skin: Secondary | ICD-10-CM

## 2021-07-07 DIAGNOSIS — R200 Anesthesia of skin: Secondary | ICD-10-CM

## 2021-07-07 IMAGING — MR MR LUMBAR SPINE W/O CM
4 of 5 series · 27 of 48 positions shown · non-contrast
Comparison: None.

CLINICAL DATA: Low back pain

EXAM:
MRI LUMBAR SPINE WITHOUT CONTRAST
TECHNIQUE: Multiplanar, multisequence MR imaging of the lumbar spine was
performed. No intravenous contrast was administered.

[Series 4: T2 · sagittal · 4.0mm · 1.17mm/px · 6 of 17 slices shown (1 of 2)]
[im 1/17]
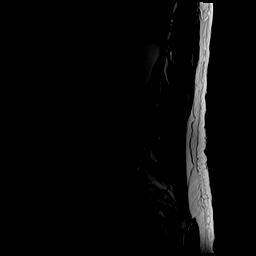
[im 4/17]
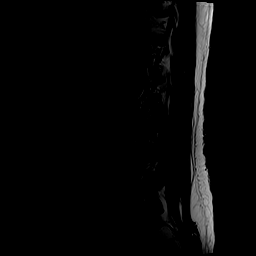
[im 7/17]
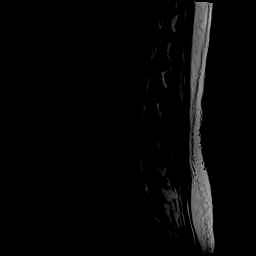
[im 10/17]
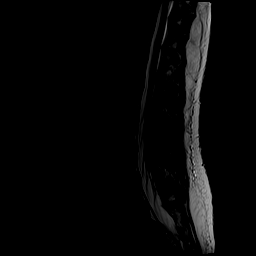
[im 13/17]
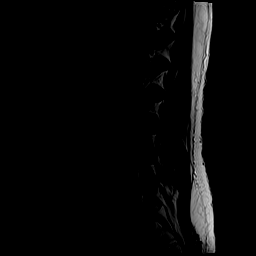
[im 17/17]
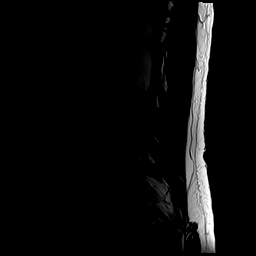

[Series 5: T1 · sagittal · 4.0mm · 1.17mm/px · 6 of 17 slices shown (1 of 2)]
[im 1/17]
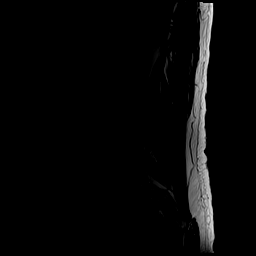
[im 4/17]
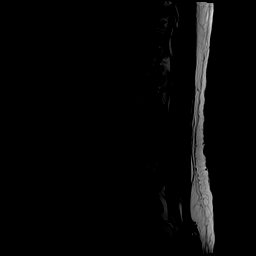
[im 7/17]
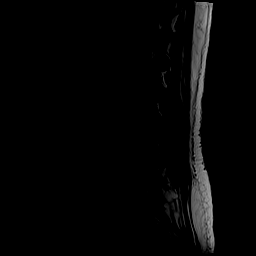
[im 10/17]
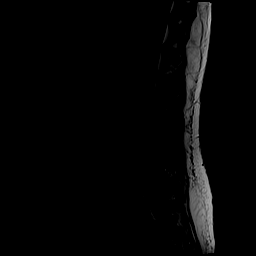
[im 13/17]
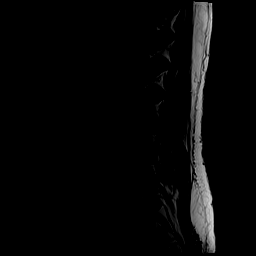
[im 17/17]
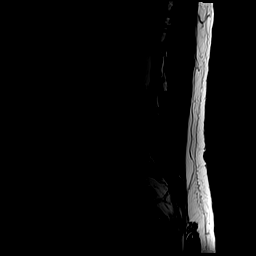

[Series 6: T2 · axial · 4.0mm · 0.39mm/px · z∈[-73,+161]mm · 9 of 46 slices shown (2 of 2)]
[im 1/46]
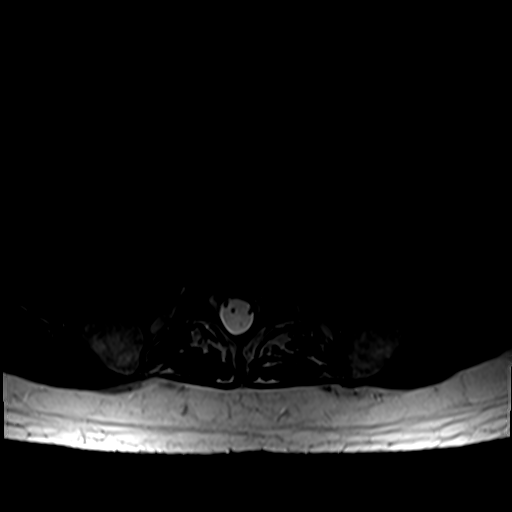
[im 7/46]
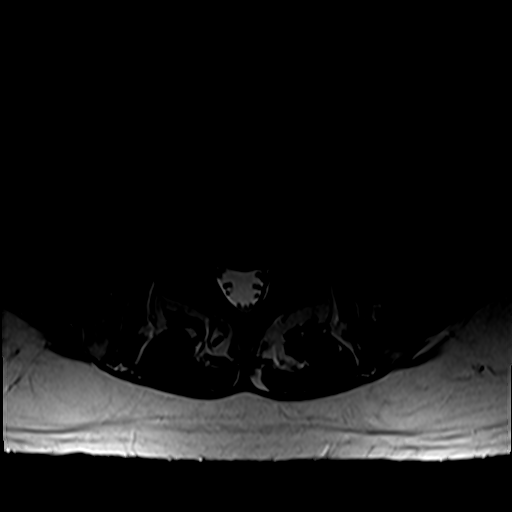
[im 13/46]
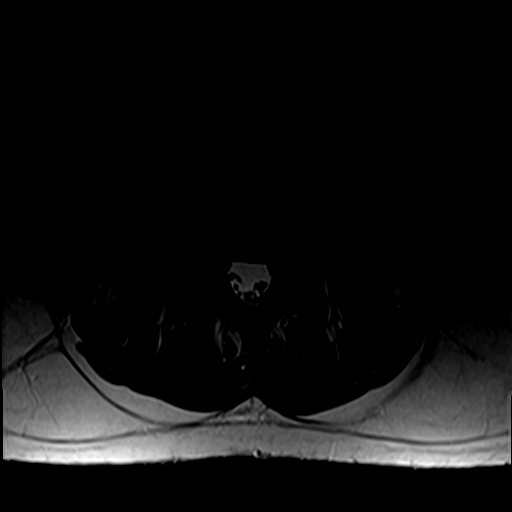
[im 20/46]
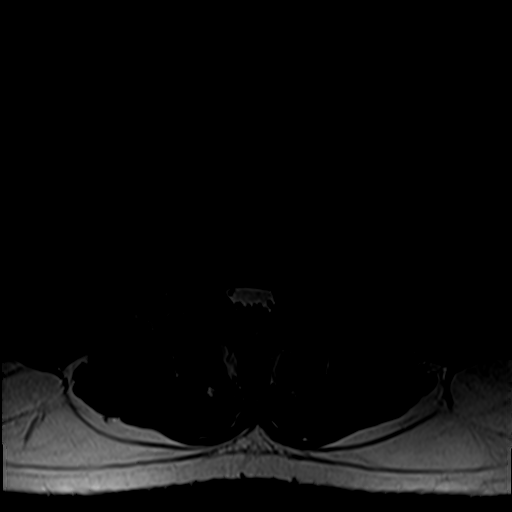
[im 23/46]
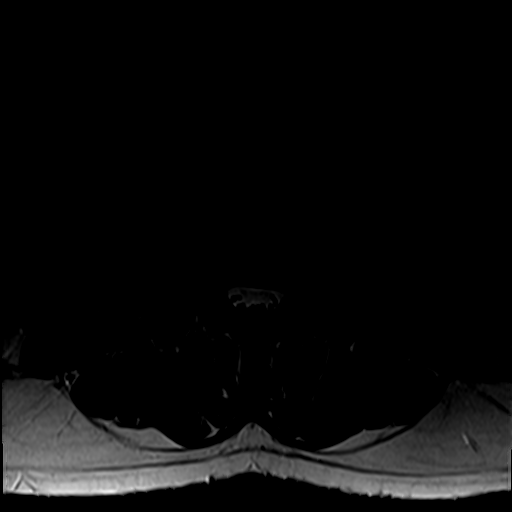
[im 26/46]
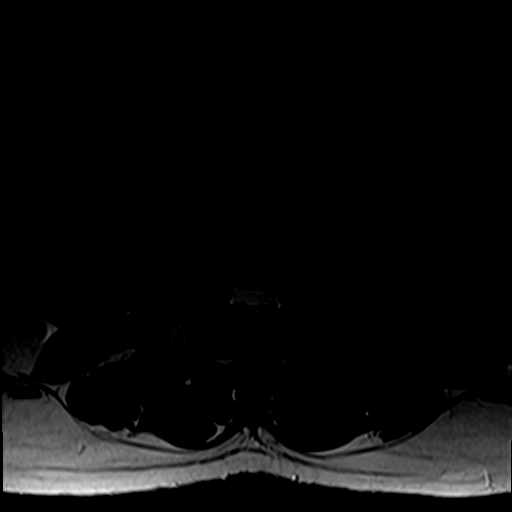
[im 33/46]
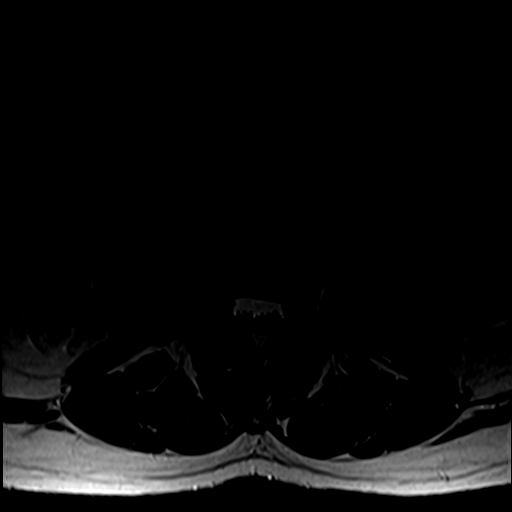
[im 39/46]
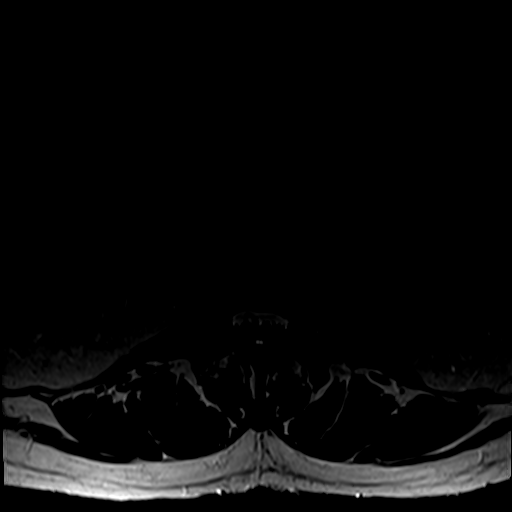
[im 46/46]
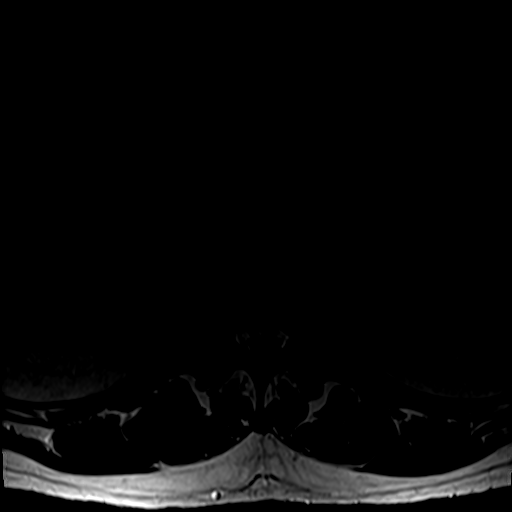

[Series 7: T1 · axial · 4.0mm · 0.39mm/px · z∈[-73,+128]mm · 6 of 46 slices shown (2 of 2)]
[im 1/46]
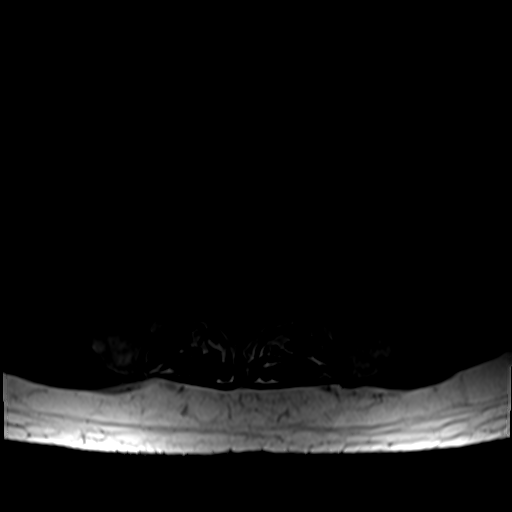
[im 7/46]
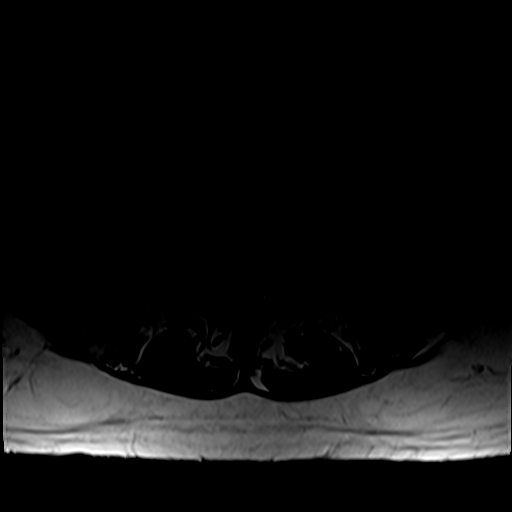
[im 13/46]
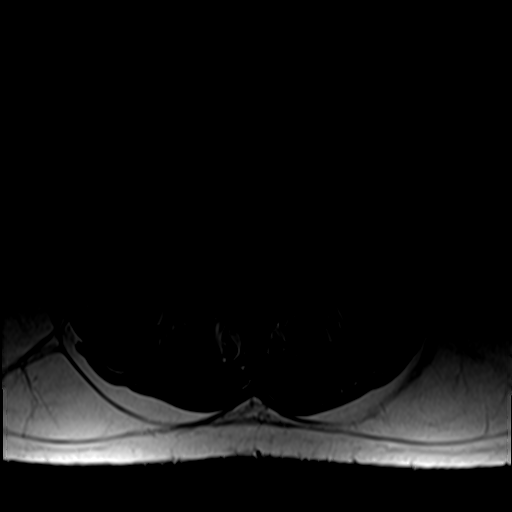
[im 20/46]
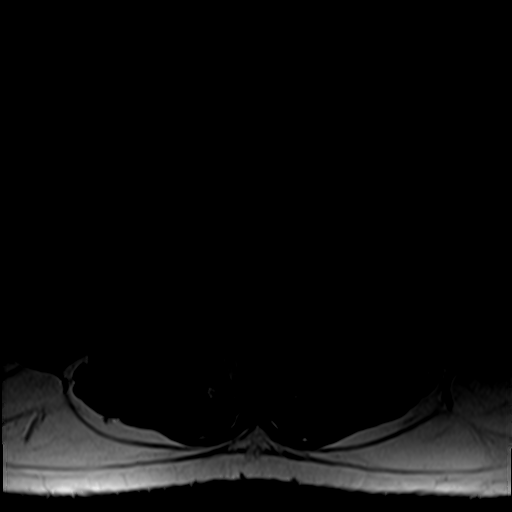
[im 23/46]
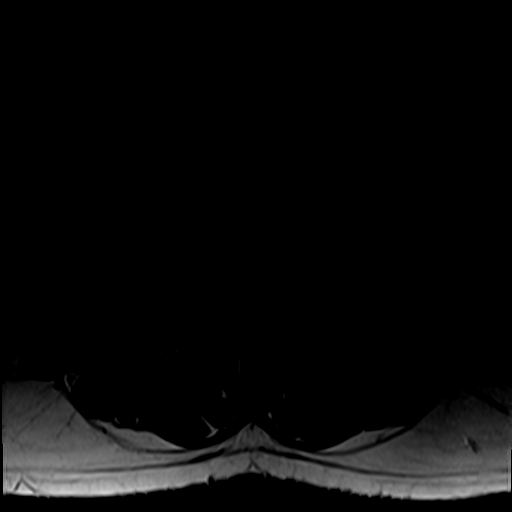
[im 39/46]
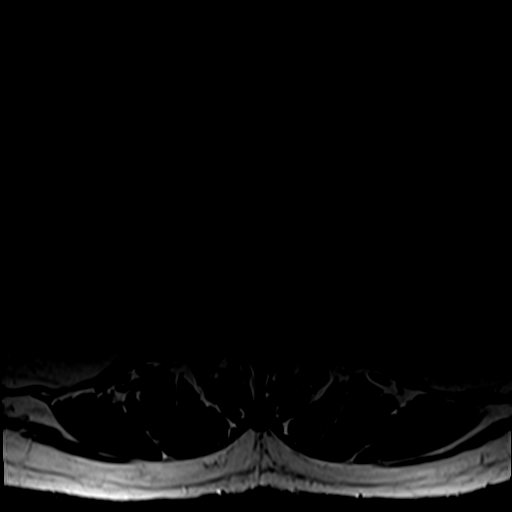

[27 of 48 positions shown; findings below may reference images not displayed]

FINDINGS: Segmentation:  Normal

Alignment:  5 mm retrolisthesis L5-S1.  Otherwise normal alignment.

Vertebrae:  Negative for fracture or mass.  Hemangioma T12, L1, L3.

Conus medullaris and cauda equina: Conus extends to the L1-2 level.
Conus and cauda equina appear normal.

Paraspinal and other soft tissues: Negative for paraspinous mass or
adenopathy.

Disc levels:

T12-L1: Moderate disc degeneration. Negative for disc protrusion or
stenosis

L1-2: Moderate disc degeneration.  Negative for stenosis

L2-3: Mild disc bulging.  Negative for stenosis

L3-4: Shallow left foraminal disc protrusion with left foraminal
encroachment. Left L3 nerve root impingement. Mild facet
degeneration. Spinal canal normal in size

L4-5: Annular fissure on the left with shallow left foraminal disc
protrusion. No significant foraminal encroachment. Spinal canal
normal in size

L5-S1: 5 mm retrolisthesis. Disc degeneration with diffuse endplate
spurring. Moderate subarticular stenosis bilaterally due to
spurring.
IMPRESSION: Left foraminal disc protrusion L3-4 with left L3 nerve root
impingement

Small left foraminal disc protrusion L4-5 without neural impingement

Disc degeneration and endplate spurring L5-S1. Moderate subarticular
stenosis bilaterally.

## 2021-07-09 ENCOUNTER — Telehealth: Payer: Self-pay | Admitting: Family Medicine

## 2021-07-09 NOTE — Telephone Encounter (Signed)
Lumbar MRI scan is notable for a left-sided disc protrusion at L3-4 impinging against the L3 nerve root.  There is an L4-5 protrusion on the left without nerve impingement.

## 2021-07-23 ENCOUNTER — Encounter: Payer: Self-pay | Admitting: Family Medicine

## 2021-07-23 DIAGNOSIS — M545 Low back pain, unspecified: Secondary | ICD-10-CM

## 2021-09-06 ENCOUNTER — Other Ambulatory Visit: Payer: Self-pay

## 2021-09-06 ENCOUNTER — Ambulatory Visit: Payer: Medicare Other | Admitting: Physical Medicine and Rehabilitation

## 2021-09-15 ENCOUNTER — Ambulatory Visit: Payer: Self-pay

## 2021-09-15 ENCOUNTER — Ambulatory Visit (INDEPENDENT_AMBULATORY_CARE_PROVIDER_SITE_OTHER): Payer: Medicare Other | Admitting: Physical Medicine and Rehabilitation

## 2021-09-15 ENCOUNTER — Other Ambulatory Visit: Payer: Self-pay

## 2021-09-15 ENCOUNTER — Encounter: Payer: Self-pay | Admitting: Physical Medicine and Rehabilitation

## 2021-09-15 VITALS — BP 121/80 | HR 103

## 2021-09-15 DIAGNOSIS — M545 Low back pain, unspecified: Secondary | ICD-10-CM | POA: Diagnosis not present

## 2021-09-15 DIAGNOSIS — M5116 Intervertebral disc disorders with radiculopathy, lumbar region: Secondary | ICD-10-CM

## 2021-09-15 DIAGNOSIS — M5412 Radiculopathy, cervical region: Secondary | ICD-10-CM | POA: Diagnosis not present

## 2021-09-15 DIAGNOSIS — M542 Cervicalgia: Secondary | ICD-10-CM | POA: Diagnosis not present

## 2021-09-15 DIAGNOSIS — G8929 Other chronic pain: Secondary | ICD-10-CM

## 2021-09-15 MED ORDER — BETAMETHASONE SOD PHOS & ACET 6 (3-3) MG/ML IJ SUSP: 12.0000 mg | Freq: Once | INTRAMUSCULAR | Status: DC

## 2021-09-15 NOTE — Progress Notes (Signed)
Steven Sanford - 65 y.o. adult MRN [id number]  Date of birth: 29-Oct-1956  Office Visit Note: Visit Date: 09/15/2021 PCP: Patient, No Pcp Per (Inactive) Referred by: Eunice Blase, MD  Subjective: Chief Complaint  Patient presents with   Neck - Pain   Right Shoulder - Pain   Right Foot - Pain, Numbness   Left Foot - Numbness, Pain   HPI: Steven Sanford is a 65 y.o. adult who comes in today At the request of Dr. Eunice Blase for what was supposed to be a left L3 transforaminal epidural steroid injection.  The history is somewhat confusing because most of the patient's care has been carried out through messaging back-and-forth with Dr. Junius Roads and Dr.Hilts's assistant as well as the patient being on a boat traveling through [place] and to the [place].  I have gone through meticulously several pages of notes and it sounds like to me what happened as the patient in March was complaining of neck and arm pain which has been chronic for him.  We have actually completed 2 epidural injections here for that with only temporary relief.  He seems to have a C8 radiculopathy with severe narrowing of C8 on that side.  He has MRI evidence of this.  At that same visit in March he complained of pain in the ball of both feet, with numbness -- started in the left foot around 2 years ago (was mild at first). He is starting to feel numbness into the toes. No issues with balance at this time.  He feels like this has significantly worsened.  Dr.Hilts at that time ordered an electrodiagnostic study of both lower limbs to be performed at [facility] with Dr. Marlaine Hind.  That evidently took some time to get scheduled and performed.  That test was done in June.  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  The patient is not diabetic.  Dr. Brien Few at that point decided that the patient may need an MRI of his lower spine even though  the electrodiagnostic study did not show any evidence of this he felt like clinically the patient had symptoms of stenosis.  Ultimately Dr. Junius Roads did order lumbar spine MRI.  This is reviewed below and reviewed with the patient today.  This shows mild finding at L3-4 on the left and the patient did have some left low back pain at 1 point.  There is no focal stenosis or nerve compression or anything that would lead to really symptoms in the feet.  I discussed all these findings with the patient and we spent probably 30 minutes talking about this.  We had decided to complete a cervical epidural injection but at that point he decided really since they had not helped before it really was not worth doing.  He is going to follow-up with Dr. Rodell Perna at some point for his neck and potential acute surgical referral may be for that.  For his feet I would suggest follow-up with neurology or his primary care physician.    Review of Systems  Musculoskeletal:  Positive for back pain, joint pain and neck pain.  Neurological:  Positive for tingling.  All other systems reviewed and are negative. Otherwise per HPI.  Assessment & Plan: Visit Diagnoses:    ICD-10-CM   1. Cervical radiculopathy  M54.12 DISCONTINUED: betamethasone acetate-betamethasone sodium phosphate (CELESTONE) injection 12 mg    CANCELED: XR C-ARM NO REPORT  CANCELED: Epidural Steroid injection    2. Cervicalgia  M54.2     3. Radiculopathy due to lumbar intervertebral disc disorder  M51.16     4. Chronic left-sided low back pain without sciatica  M54.50    G89.29        Plan: Findings:  1.  Chronic worsening severe neck and right shoulder and shoulder blade pain.  He has had 2 epidural injections as well as extensive physical therapy and medication management without any really long-lasting results.  He does have MRI showing pretty significant foraminal narrowing at C7-T1 which would be the C8 nerve root which does fit with his symptoms.   Patient probably should follow-up with Dr. Leana Gamer consider surgical correction.  Patient did not want epidural injection today which I did offer just in case it might give him some relief.  His biggest complaint today is the neck and arm pain.  2.  Bilateral foot pain particular in the balls of both feet with some numbness and tingling in all the toes in a nondermatomal fashion.  No real sign of neuroma symptoms.  To me not very consistent with neurogenic claudication and MRI confirms that there is no central stenosis.  Electrodiagnostic study by Dr. Marlaine Hind shows suggestive of a mild probably peripheral neuropathy although it looking at the data this does not show a definitive result.  Could follow-up with a neurologist.  May wish to follow-up with Dr. Meridee Score foot and ankle consultation.   Meds & Orders:  Meds ordered this encounter  Medications   DISCONTD: betamethasone acetate-betamethasone sodium phosphate (CELESTONE) injection 12 mg    No orders of the defined types were placed in this encounter.   Follow-up: No follow-ups on file.   Procedures: No procedures performed      Clinical History: 06/10/2021 electrodiagnostic study of the bilateral lower limbs by Dr. Marlaine Hind at [facility]. 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  There was no evidence of radiculopathy but Dr. Brien Few felt like with his symptoms he may need MRI do to what sounded like possible lumbar stenosis. ---- MRI LUMBAR SPINE WITHOUT CONTRAST   TECHNIQUE: Multiplanar, multisequence MR imaging of the lumbar spine was performed. No intravenous contrast was administered.   COMPARISON:  None.   FINDINGS: Segmentation:  Normal   Alignment:  5 mm retrolisthesis L5-S1.  Otherwise normal alignment.   Vertebrae:  Negative for fracture or mass.  Hemangioma T12, L1, L3.   Conus medullaris and cauda equina: Conus extends to the L1-2  level. Conus and cauda equina appear normal.   Paraspinal and other soft tissues: Negative for paraspinous mass or adenopathy.   Disc levels:   T12-L1: Moderate disc degeneration. Negative for disc protrusion or stenosis   L1-2: Moderate disc degeneration.  Negative for stenosis   L2-3: Mild disc bulging.  Negative for stenosis   L3-4: Shallow left foraminal disc protrusion with left foraminal encroachment. Left L3 nerve root impingement. Mild facet degeneration. Spinal canal normal in size   L4-5: Annular fissure on the left with shallow left foraminal disc protrusion. No significant foraminal encroachment. Spinal canal normal in size   L5-S1: 5 mm retrolisthesis. Disc degeneration with diffuse endplate spurring. Moderate subarticular stenosis bilaterally due to spurring.   IMPRESSION: Left foraminal disc protrusion L3-4 with left L3 nerve root impingement   Small left foraminal disc protrusion L4-5 without neural impingement   Disc degeneration and endplate spurring E3-O1. Moderate subarticular stenosis bilaterally.  Electronically Signed By: Franchot Gallo M.D. On: 07/08/2021 10:42 ------ MRI CERVICAL SPINE WITHOUT CONTRAST   TECHNIQUE: Multiplanar, multisequence MR imaging of the cervical spine was performed. No intravenous contrast was administered.   COMPARISON: Cervical spine radiographs 12/11/2019   FINDINGS: Alignment: 2 mm retrolisthesis C3-4. Slight retrolisthesis C6-7 and C7-T1   Vertebrae: Normal bone marrow. Negative for fracture or mass.   Cord: Cord evaluation limited by motion. No cord signal abnormality is detected allowing for motion.   Posterior Fossa, vertebral arteries, paraspinal tissues: Negative   Disc levels:   C2-3: Small central disc protrusion without significant stenosis.   C3-4: Disc degeneration with mild uncinate spurring and mild facet degeneration. Cord flattening with mild spinal stenosis. Neural foramina patent  bilaterally   C4-5: Small central disc protrusion. Negative for spinal or foraminal stenosis.   C5-6: Mild disc and mild facet degeneration. Mild foraminal narrowing on the right. Left foramen patent   C6-7: Small central and left foraminal disc protrusion. Bilateral facet hypertrophy. Moderate foraminal stenosis bilaterally and mild spinal stenosis   C7-T1: Marked left foraminal encroachment with impingement of the left C8 nerve root. There is left foraminal disc protrusion and osteophyte contributing to stenosis. Moderate right foraminal stenosis.   IMPRESSION: Multilevel degenerative change throughout the cervical spine as above   Regarding the patient's left arm pain, there is moderate left foraminal stenosis at C6-7 and severe left foraminal encroachment at C7-T1.     Electronically Signed By: Franchot Gallo M.D. On: 08/24/2020 10:30   He reports that he has never smoked. He has never used smokeless tobacco.  Recent Labs    03/25/21 0930  HGBA1C 5.4    Objective:  VS:  HT:    WT:   BMI:     BP:121/80  HR: (!) 103bpm  TEMP: ( )  RESP:  Physical Exam Vitals and nursing note reviewed.  Constitutional:      General: He is not in acute distress.    Appearance: Normal appearance. He is well-developed.  HENT:     Head: Normocephalic and atraumatic.     Nose: Nose normal.     Mouth/Throat:     Mouth: Mucous membranes are moist.     Pharynx: Oropharynx is clear.  Eyes:     Conjunctiva/sclera: Conjunctivae normal.     Pupils: Pupils are equal, round, and reactive to light.  Cardiovascular:     Rate and Rhythm: Regular rhythm.  Pulmonary:     Effort: Pulmonary effort is normal. No respiratory distress.  Abdominal:     General: There is no distension.     Palpations: Abdomen is soft.     Tenderness: There is no guarding.  Musculoskeletal:        General: No signs of injury.     Cervical back: Neck supple. Tenderness present.     Right lower leg: No edema.      Left lower leg: No edema.     Comments: Examination of the cervical spine shows forward flexed cervical spine with positive Spurling's test to the left.  He does have some focal trigger points in the trapezius and rhomboid.  Mild shoulder impingement.  Good strength in the upper extremities.  Lower extremity exam shows that the patient ambulates without aid.  He can arise from seated position without much difficulty.  Some pain with facet loading of the lower back.  No real sensation differences in the lower legs bilaterally.  No swelling.  Skin:    General: Skin  is warm and dry.     Findings: No erythema or rash.  Neurological:     General: No focal deficit present.     Mental Status: He is alert and oriented to person, place, and time.     Sensory: No sensory deficit.     Motor: No weakness or abnormal muscle tone.     Coordination: Coordination normal.     Gait: Gait normal.  Psychiatric:        Mood and Affect: Mood normal.        Behavior: Behavior normal.        Thought Content: Thought content normal.    Ortho Exam  Imaging: No results found.  Past Medical/Family/Surgical/Social History: Medications & Allergies reviewed per EMR, new medications updated. Patient Active Problem List   Diagnosis Date Noted   Pre-operative cardiovascular examination 01/15/2018   Pure hypercholesterolemia, unspecified 04/17/2015   Family history of ischemic heart disease and other diseases of the circulatory system 03/05/2015   Essential (primary) hypertension 02/26/2015   Past Medical History:  Diagnosis Date   Hypertension    History reviewed. No pertinent family history. History reviewed. No pertinent surgical history. Social History   Occupational History   Not on file  Tobacco Use   Smoking status: Never   Smokeless tobacco: Never  Substance and Sexual Activity   Alcohol use: Not on file   Drug use: Not on file   Sexual activity: Not on file

## 2021-09-15 NOTE — Progress Notes (Signed)
Pt state neck pain that travels to his right shoulder and blade. Pt state any movement with his neck and shoulder the pain gets worse. Pt state he doesn't take anything for the pain.  Numeric Pain Rating Scale and Functional Assessment Average Pain 3   In the last MONTH (on 0-10 scale) has pain interfered with the following?  1. General activity like being  able to carry out your everyday physical activities such as walking, climbing stairs, carrying groceries, or moving a chair?  Rating(7)   +Driver, -BT, -Dye Allergies.

## 2021-09-24 ENCOUNTER — Encounter: Payer: Self-pay | Admitting: Physical Medicine and Rehabilitation

## 2021-10-06 ENCOUNTER — Ambulatory Visit: Payer: Medicare Other | Admitting: Physical Medicine and Rehabilitation

## 2022-06-01 ENCOUNTER — Encounter: Payer: Self-pay | Admitting: Internal Medicine

## 2022-06-01 ENCOUNTER — Ambulatory Visit: Payer: Medicare Other | Admitting: Internal Medicine

## 2022-06-01 VITALS — BP 136/82 | HR 93 | Temp 98.6°F | Ht 71.5 in | Wt 229.0 lb

## 2022-06-01 DIAGNOSIS — G608 Other hereditary and idiopathic neuropathies: Secondary | ICD-10-CM

## 2022-06-01 DIAGNOSIS — K219 Gastro-esophageal reflux disease without esophagitis: Secondary | ICD-10-CM

## 2022-06-01 DIAGNOSIS — I10 Essential (primary) hypertension: Secondary | ICD-10-CM | POA: Diagnosis not present

## 2022-06-01 DIAGNOSIS — Z0001 Encounter for general adult medical examination with abnormal findings: Secondary | ICD-10-CM | POA: Diagnosis not present

## 2022-06-01 DIAGNOSIS — N5201 Erectile dysfunction due to arterial insufficiency: Secondary | ICD-10-CM | POA: Insufficient documentation

## 2022-06-01 DIAGNOSIS — E6609 Other obesity due to excess calories: Secondary | ICD-10-CM

## 2022-06-01 DIAGNOSIS — Z6831 Body mass index (BMI) 31.0-31.9, adult: Secondary | ICD-10-CM | POA: Diagnosis not present

## 2022-06-01 DIAGNOSIS — E785 Hyperlipidemia, unspecified: Secondary | ICD-10-CM | POA: Diagnosis not present

## 2022-06-01 DIAGNOSIS — N400 Enlarged prostate without lower urinary tract symptoms: Secondary | ICD-10-CM

## 2022-06-01 DIAGNOSIS — N401 Enlarged prostate with lower urinary tract symptoms: Secondary | ICD-10-CM | POA: Insufficient documentation

## 2022-06-01 DIAGNOSIS — R000 Tachycardia, unspecified: Secondary | ICD-10-CM

## 2022-06-01 DIAGNOSIS — K635 Polyp of colon: Secondary | ICD-10-CM

## 2022-06-01 LAB — HEPATIC FUNCTION PANEL
ALT: 25 U/L (ref 0–53)
AST: 18 U/L (ref 0–37)
Albumin: 4.6 g/dL (ref 3.5–5.2)
Alkaline Phosphatase: 55 U/L (ref 39–117)
Bilirubin, Direct: 0.1 mg/dL (ref 0.0–0.3)
Total Bilirubin: 0.6 mg/dL (ref 0.2–1.2)
Total Protein: 6.8 g/dL (ref 6.0–8.3)

## 2022-06-01 LAB — BASIC METABOLIC PANEL
BUN: 15 mg/dL (ref 6–23)
CO2: 29 mEq/L (ref 19–32)
Calcium: 9.4 mg/dL (ref 8.4–10.5)
Chloride: 100 mEq/L (ref 96–112)
Creatinine, Ser: 1.04 mg/dL (ref 0.40–1.50)
GFR: 74.19 mL/min (ref >60.00)
Glucose, Bld: 101 mg/dL — ABNORMAL HIGH (ref 70–99)
Potassium: 4.7 mEq/L (ref 3.5–5.1)
Sodium: 138 mEq/L (ref 135–145)

## 2022-06-01 LAB — URINALYSIS, ROUTINE W REFLEX MICROSCOPIC
Bilirubin Urine: NEGATIVE
Hgb urine dipstick: NEGATIVE
Ketones, ur: NEGATIVE
Leukocytes,Ua: NEGATIVE
Nitrite: NEGATIVE
RBC / HPF: NONE SEEN (ref 0–?)
Specific Gravity, Urine: 1.015 (ref 1.000–1.030)
Total Protein, Urine: NEGATIVE
Urine Glucose: NEGATIVE
Urobilinogen, UA: 0.2 (ref 0.0–1.0)
pH: 7.5 (ref 5.0–8.0)

## 2022-06-01 LAB — CBC WITH DIFFERENTIAL/PLATELET
Basophils Absolute: 0 10*3/uL (ref 0.0–0.1)
Basophils Relative: 0.6 % (ref 0.0–3.0)
Eosinophils Absolute: 0.2 10*3/uL (ref 0.0–0.7)
Eosinophils Relative: 2.8 % (ref 0.0–5.0)
HCT: 44.3 % (ref 39.0–52.0)
Hemoglobin: 14.7 g/dL (ref 13.0–17.0)
Lymphocytes Relative: 34.5 % (ref 12.0–46.0)
Lymphs Abs: 2.3 10*3/uL (ref 0.7–4.0)
MCHC: 33.1 g/dL (ref 30.0–36.0)
MCV: 92.3 fl (ref 78.0–100.0)
Monocytes Absolute: 0.6 10*3/uL (ref 0.1–1.0)
Monocytes Relative: 9.2 % (ref 3.0–12.0)
Neutro Abs: 3.5 10*3/uL (ref 1.4–7.7)
Neutrophils Relative %: 52.9 % (ref 43.0–77.0)
Platelets: 247 10*3/uL (ref 150.0–400.0)
RBC: 4.8 Mil/uL (ref 4.22–5.81)
RDW: 14 % (ref 11.5–15.5)
WBC: 6.6 10*3/uL (ref 4.0–10.5)

## 2022-06-01 LAB — LIPID PANEL
Cholesterol: 159 mg/dL (ref 0–200)
HDL: 63.7 mg/dL (ref >39.00)
LDL Cholesterol: 79 mg/dL (ref 0–99)
NonHDL: 95.15
Total CHOL/HDL Ratio: 2
Triglycerides: 79 mg/dL (ref 0.0–149.0)
VLDL: 15.8 mg/dL (ref 0.0–40.0)

## 2022-06-01 LAB — VITAMIN B12: Vitamin B-12: 313 pg/mL (ref 211–911)

## 2022-06-01 LAB — FOLATE: Folate: 9.9 ng/mL (ref >5.9)

## 2022-06-01 LAB — TSH: TSH: 1.41 u[IU]/mL (ref 0.35–5.50)

## 2022-06-01 LAB — HEMOGLOBIN A1C: Hgb A1c MFr Bld: 5.7 % (ref 4.6–6.5)

## 2022-06-01 LAB — PSA: PSA: 3.18 ng/mL (ref 0.10–4.00)

## 2022-06-01 MED ORDER — SEMAGLUTIDE-WEIGHT MANAGEMENT 1 MG/0.5ML ~~LOC~~ SOAJ: 1.0000 mg | SUBCUTANEOUS | 0 refills | Status: DC

## 2022-06-01 MED ORDER — SEMAGLUTIDE-WEIGHT MANAGEMENT 2.4 MG/0.75ML ~~LOC~~ SOAJ: 2.4000 mg | SUBCUTANEOUS | 0 refills | Status: DC

## 2022-06-01 MED ORDER — SEMAGLUTIDE-WEIGHT MANAGEMENT 0.25 MG/0.5ML ~~LOC~~ SOAJ: 0.2500 mg | SUBCUTANEOUS | 0 refills | Status: DC

## 2022-06-01 MED ORDER — SEMAGLUTIDE-WEIGHT MANAGEMENT 1.7 MG/0.75ML ~~LOC~~ SOAJ: 1.7000 mg | SUBCUTANEOUS | 0 refills | Status: DC

## 2022-06-01 MED ORDER — SEMAGLUTIDE-WEIGHT MANAGEMENT 0.5 MG/0.5ML ~~LOC~~ SOAJ: 0.5000 mg | SUBCUTANEOUS | 0 refills | Status: DC

## 2022-06-01 MED ORDER — TADALAFIL 20 MG PO TABS: 10.0000 mg | ORAL_TABLET | ORAL | 3 refills | Status: DC | PRN

## 2022-06-01 NOTE — Patient Instructions (Signed)
Health Maintenance, Male Adopting a healthy lifestyle and getting preventive care are important in promoting health and wellness. Ask your health care provider about: The right schedule for you to have regular tests and exams. Things you can do on your own to prevent diseases and keep yourself healthy. What should I know about diet, weight, and exercise? Eat a healthy diet  Eat a diet that includes plenty of vegetables, fruits, low-fat dairy products, and lean protein. Do not eat a lot of foods that are high in solid fats, added sugars, or sodium. Maintain a healthy weight Body mass index (BMI) is a measurement that can be used to identify possible weight problems. It estimates body fat based on height and weight. Your health care provider can help determine your BMI and help you achieve or maintain a healthy weight. Get regular exercise Get regular exercise. This is one of the most important things you can do for your health. Most adults should: Exercise for at least 150 minutes each week. The exercise should increase your heart rate and make you sweat (moderate-intensity exercise). Do strengthening exercises at least twice a week. This is in addition to the moderate-intensity exercise. Spend less time sitting. Even light physical activity can be beneficial. Watch cholesterol and blood lipids Have your blood tested for lipids and cholesterol at 68 years of age, then have this test every 5 years. You may need to have your cholesterol levels checked more often if: Your lipid or cholesterol levels are high. You are older than 68 years of age. You are at high risk for heart disease. What should I know about cancer screening? Many types of cancers can be detected early and may often be prevented. Depending on your health history and family history, you may need to have cancer screening at various ages. This may include screening for: Colorectal cancer. Prostate cancer. Skin cancer. Lung  cancer. What should I know about heart disease, diabetes, and high blood pressure? Blood pressure and heart disease High blood pressure causes heart disease and increases the risk of stroke. This is more likely to develop in people who have high blood pressure readings or are overweight. Talk with your health care provider about your target blood pressure readings. Have your blood pressure checked: Every 3-5 years if you are 18-39 years of age. Every year if you are 40 years old or older. If you are between the ages of 65 and 75 and are a current or former smoker, ask your health care provider if you should have a one-time screening for abdominal aortic aneurysm (AAA). Diabetes Have regular diabetes screenings. This checks your fasting blood sugar level. Have the screening done: Once every three years after age 45 if you are at a normal weight and have a low risk for diabetes. More often and at a younger age if you are overweight or have a high risk for diabetes. What should I know about preventing infection? Hepatitis B If you have a higher risk for hepatitis B, you should be screened for this virus. Talk with your health care provider to find out if you are at risk for hepatitis B infection. Hepatitis C Blood testing is recommended for: Everyone born from 1945 through 1965. Anyone with known risk factors for hepatitis C. Sexually transmitted infections (STIs) You should be screened each year for STIs, including gonorrhea and chlamydia, if: You are sexually active and are younger than 68 years of age. You are older than 68 years of age and your   health care provider tells you that you are at risk for this type of infection. Your sexual activity has changed since you were last screened, and you are at increased risk for chlamydia or gonorrhea. Ask your health care provider if you are at risk. Ask your health care provider about whether you are at high risk for HIV. Your health care provider  may recommend a prescription medicine to help prevent HIV infection. If you choose to take medicine to prevent HIV, you should first get tested for HIV. You should then be tested every 3 months for as long as you are taking the medicine. Follow these instructions at home: Alcohol use Do not drink alcohol if your health care provider tells you not to drink. If you drink alcohol: Limit how much you have to 0-2 drinks a day. Know how much alcohol is in your drink. In the [place] one drink equals one 12 oz bottle of beer (355 mL), one 5 oz glass of wine (148 mL), or one 1 oz glass of hard liquor (44 mL). Lifestyle Do not use any products that contain nicotine or tobacco. These products include cigarettes, chewing tobacco, and vaping devices, such as e-cigarettes. If you need help quitting, ask your health care provider. Do not use street drugs. Do not share needles. Ask your health care provider for help if you need support or information about quitting drugs. General instructions Schedule regular health, dental, and eye exams. Stay current with your vaccines. Tell your health care provider if: You often feel depressed. You have ever been abused or do not feel safe at home. Summary Adopting a healthy lifestyle and getting preventive care are important in promoting health and wellness. Follow your health care provider's instructions about healthy diet, exercising, and getting tested or screened for diseases. Follow your health care provider's instructions on monitoring your cholesterol and blood pressure. This information is not intended to replace advice given to you by your health care provider. Make sure you discuss any questions you have with your health care provider. Document Revised: 05/03/2021 Document Reviewed: 05/03/2021 Elsevier Patient Education  2023 [employer]  

## 2022-06-01 NOTE — Progress Notes (Unsigned)
Subjective:  Patient ID: Steven Sanford, male    DOB: [birth year]  Age: 66 y.o. MRN: [id number]  CC: Annual Exam, Hypertension, Hyperlipidemia, and Gastroesophageal Reflux   HPI Steven Sanford presents for a CPX and to establish.  History Steven Sanford has a past medical history of Hypertension.   He has no past surgical history on file.   His family history is not on file.He reports that he has never smoked. He has never used smokeless tobacco. No history on file for alcohol use and drug use.  Outpatient Medications Prior to Visit  Medication Sig Dispense Refill   gabapentin (NEURONTIN) 100 MG capsule 1 PO q HS, may increase to 3 PO TID if needed/tolerated. 90 capsule 3   atorvastatin (LIPITOR) 40 MG tablet Take by mouth.     dexlansoprazole (DEXILANT) 60 MG capsule Take by mouth.     enalapril (VASOTEC) 10 MG tablet Take 10 mg by mouth daily.     amLODipine (NORVASC) 5 MG tablet Take by mouth.     No facility-administered medications prior to visit.    ROS Review of Systems  Objective:  BP 136/82 (BP Location: Right Arm, Patient Position: Sitting, Cuff Size: Large)   Pulse 93   Temp 98.6 F (37 C) (Oral)   Ht 5' 11.5" (1.816 m)   Wt 229 lb (103.9 kg)   SpO2 97%   BMI 31.49 kg/m   Physical Exam Cardiovascular:     Rate and Rhythm: Normal rate and regular rhythm.     Heart sounds: Normal heart sounds, S1 normal and S2 normal.     No gallop.     Comments: EKG- NSR, 100 bpm Low voltage No LVH or Q waves No prior EKG's to compare Musculoskeletal:     Right lower leg: No edema.     Left lower leg: No edema.     Lab Results  Component Value Date   WBC 6.6 06/01/2022   HGB 14.7 06/01/2022   HCT 44.3 06/01/2022   PLT 247.0 06/01/2022   GLUCOSE 101 (H) 06/01/2022   CHOL 159 06/01/2022   TRIG 79.0 06/01/2022   HDL 63.70 06/01/2022   LDLCALC 79 06/01/2022   ALT 25 06/01/2022   AST 18 06/01/2022   NA 138 06/01/2022   K 4.7 06/01/2022   CL 100 06/01/2022    CREATININE 1.04 06/01/2022   BUN 15 06/01/2022   CO2 29 06/01/2022   TSH 1.41 06/01/2022   PSA 3.18 06/01/2022   HGBA1C 5.7 06/01/2022     Assessment & Plan:   Steven Sanford was seen today for annual exam, hypertension, hyperlipidemia and gastroesophageal reflux.  Diagnoses and all orders for this visit:  Encounter for general adult medical examination with abnormal findings -     CBC with Differential/Platelet; Future -     CBC with Differential/Platelet  Hyperlipidemia LDL goal <100 -     Lipid panel; Future -     Hepatic function panel; Future -     Hepatic function panel -     Lipid panel -     atorvastatin (LIPITOR) 40 MG tablet; Take 1 tablet (40 mg total) by mouth daily.  Gastroesophageal reflux disease without esophagitis -     CBC with Differential/Platelet; Future -     CBC with Differential/Platelet -     Discontinue: dexlansoprazole (DEXILANT) 60 MG capsule; Take 1 capsule (60 mg total) by mouth daily. -     dexlansoprazole (DEXILANT) 60 MG capsule; Take 1 capsule (60 mg total)  by mouth daily.  Primary hypertension -     Basic metabolic panel; Future -     Hepatic function panel; Future -     TSH; Future -     EKG 12-Lead -     TSH -     Hepatic function panel -     Basic metabolic panel -     enalapril (VASOTEC) 10 MG tablet; Take 1 tablet (10 mg total) by mouth daily.  Benign prostatic hyperplasia without lower urinary tract symptoms -     PSA; Future -     Urinalysis, Routine w reflex microscopic; Future -     Urinalysis, Routine w reflex microscopic -     PSA  Class 1 obesity due to excess calories with serious comorbidity and body mass index (BMI) of 31.0 to 31.9 in adult -     Hemoglobin A1c; Future -     TSH; Future -     TSH -     Hemoglobin A1c -     Discontinue: Semaglutide-Weight Management 0.25 MG/0.5ML SOAJ; Inject 0.25 mg into the skin once a week for 28 days. -     Discontinue: Semaglutide-Weight Management 0.5 MG/0.5ML SOAJ; Inject 0.5 mg  into the skin once a week for 28 days. -     Discontinue: Semaglutide-Weight Management 1 MG/0.5ML SOAJ; Inject 1 mg into the skin once a week for 28 days. -     Discontinue: Semaglutide-Weight Management 1.7 MG/0.75ML SOAJ; Inject 1.7 mg into the skin once a week for 28 days. -     Discontinue: Semaglutide-Weight Management 2.4 MG/0.75ML SOAJ; Inject 2.4 mg into the skin once a week for 28 days. -     Semaglutide-Weight Management 1.7 MG/0.75ML SOAJ; Inject 1.7 mg into the skin once a week for 28 days. -     Semaglutide-Weight Management 0.25 MG/0.5ML SOAJ; Inject 0.25 mg into the skin once a week for 28 days. -     Semaglutide-Weight Management 0.5 MG/0.5ML SOAJ; Inject 0.5 mg into the skin once a week for 28 days. -     Semaglutide-Weight Management 1 MG/0.5ML SOAJ; Inject 1 mg into the skin once a week for 28 days. -     Semaglutide-Weight Management 2.4 MG/0.75ML SOAJ; Inject 2.4 mg into the skin once a week for 28 days.  Peripheral sensory neuropathy -     Vitamin B1; Future -     Folate; Future -     Vitamin B12; Future -     Vitamin B12 -     Folate -     Vitamin B1  Polyp of colon, unspecified part of colon, unspecified type -     Ambulatory referral to Gastroenterology  Erectile dysfunction due to arterial insufficiency -     tadalafil (CIALIS) 20 MG tablet; Take 0.5-1 tablets (10-20 mg total) by mouth every other day as needed for erectile dysfunction.   I have discontinued Steven Mina "CJ"'s amLODipine and Dexilant. I have also changed his enalapril and atorvastatin. Additionally, I am having him start on tadalafil. Lastly, I am having him maintain his gabapentin, dexlansoprazole, Semaglutide-Weight Management, Semaglutide-Weight Management, Semaglutide-Weight Management, Semaglutide-Weight Management, and Semaglutide-Weight Management.  Meds ordered this encounter  Medications   DISCONTD: Semaglutide-Weight Management 0.25 MG/0.5ML SOAJ    Sig: Inject 0.25 mg into the  skin once a week for 28 days.    Dispense:  2 mL    Refill:  0   DISCONTD: Semaglutide-Weight Management 0.5 MG/0.5ML SOAJ  Sig: Inject 0.5 mg into the skin once a week for 28 days.    Dispense:  2 mL    Refill:  0   DISCONTD: Semaglutide-Weight Management 1 MG/0.5ML SOAJ    Sig: Inject 1 mg into the skin once a week for 28 days.    Dispense:  2 mL    Refill:  0   DISCONTD: Semaglutide-Weight Management 1.7 MG/0.75ML SOAJ    Sig: Inject 1.7 mg into the skin once a week for 28 days.    Dispense:  3 mL    Refill:  0   DISCONTD: Semaglutide-Weight Management 2.4 MG/0.75ML SOAJ    Sig: Inject 2.4 mg into the skin once a week for 28 days.    Dispense:  3 mL    Refill:  0   tadalafil (CIALIS) 20 MG tablet    Sig: Take 0.5-1 tablets (10-20 mg total) by mouth every other day as needed for erectile dysfunction.    Dispense:  6 tablet    Refill:  3   DISCONTD: dexlansoprazole (DEXILANT) 60 MG capsule    Sig: Take 1 capsule (60 mg total) by mouth daily.    Dispense:  90 capsule    Refill:  1   enalapril (VASOTEC) 10 MG tablet    Sig: Take 1 tablet (10 mg total) by mouth daily.    Dispense:  90 tablet    Refill:  1   atorvastatin (LIPITOR) 40 MG tablet    Sig: Take 1 tablet (40 mg total) by mouth daily.    Dispense:  90 tablet    Refill:  1   dexlansoprazole (DEXILANT) 60 MG capsule    Sig: Take 1 capsule (60 mg total) by mouth daily.    Dispense:  90 capsule    Refill:  1   Semaglutide-Weight Management 1.7 MG/0.75ML SOAJ    Sig: Inject 1.7 mg into the skin once a week for 28 days.    Dispense:  3 mL    Refill:  0   Semaglutide-Weight Management 0.25 MG/0.5ML SOAJ    Sig: Inject 0.25 mg into the skin once a week for 28 days.    Dispense:  2 mL    Refill:  0   Semaglutide-Weight Management 0.5 MG/0.5ML SOAJ    Sig: Inject 0.5 mg into the skin once a week for 28 days.    Dispense:  2 mL    Refill:  0   Semaglutide-Weight Management 1 MG/0.5ML SOAJ    Sig: Inject 1 mg into the  skin once a week for 28 days.    Dispense:  2 mL    Refill:  0   Semaglutide-Weight Management 2.4 MG/0.75ML SOAJ    Sig: Inject 2.4 mg into the skin once a week for 28 days.    Dispense:  3 mL    Refill:  0     Follow-up: Return in about 6 months (around 12/01/2022).  Steven Calico, MD

## 2022-06-03 ENCOUNTER — Telehealth: Payer: Self-pay | Admitting: Internal Medicine

## 2022-06-03 ENCOUNTER — Other Ambulatory Visit: Payer: Self-pay | Admitting: Internal Medicine

## 2022-06-03 DIAGNOSIS — K219 Gastro-esophageal reflux disease without esophagitis: Secondary | ICD-10-CM

## 2022-06-03 MED ORDER — DEXLANSOPRAZOLE 60 MG PO CPDR: 60.0000 mg | DELAYED_RELEASE_CAPSULE | Freq: Every day | ORAL | 1 refills | Status: DC

## 2022-06-03 MED ORDER — SEMAGLUTIDE-WEIGHT MANAGEMENT 0.25 MG/0.5ML ~~LOC~~ SOAJ
0.2500 mg | SUBCUTANEOUS | 0 refills | Status: DC
Start: 2022-06-03 — End: 2022-06-23

## 2022-06-03 MED ORDER — ENALAPRIL MALEATE 10 MG PO TABS: 10.0000 mg | ORAL_TABLET | Freq: Every day | ORAL | 1 refills | Status: DC

## 2022-06-03 MED ORDER — SEMAGLUTIDE-WEIGHT MANAGEMENT 0.5 MG/0.5ML ~~LOC~~ SOAJ: 0.5000 mg | SUBCUTANEOUS | 0 refills | Status: DC

## 2022-06-03 MED ORDER — SEMAGLUTIDE-WEIGHT MANAGEMENT 1.7 MG/0.75ML ~~LOC~~ SOAJ: 1.7000 mg | SUBCUTANEOUS | 0 refills | Status: DC

## 2022-06-03 MED ORDER — ATORVASTATIN CALCIUM 40 MG PO TABS: 40.0000 mg | ORAL_TABLET | Freq: Every day | ORAL | 1 refills | Status: DC

## 2022-06-03 MED ORDER — SEMAGLUTIDE-WEIGHT MANAGEMENT 1 MG/0.5ML ~~LOC~~ SOAJ: 1.0000 mg | SUBCUTANEOUS | 0 refills | Status: DC

## 2022-06-03 MED ORDER — SEMAGLUTIDE-WEIGHT MANAGEMENT 2.4 MG/0.75ML ~~LOC~~ SOAJ: 2.4000 mg | SUBCUTANEOUS | 0 refills | Status: DC

## 2022-06-03 NOTE — Telephone Encounter (Signed)
Patient would like all him refills resent to [facility] [address] - other pharmacy does not have part of the medications

## 2022-06-06 ENCOUNTER — Encounter: Payer: Self-pay | Admitting: Internal Medicine

## 2022-06-06 ENCOUNTER — Telehealth: Payer: Self-pay

## 2022-06-06 LAB — VITAMIN B1: Vitamin B1 (Thiamine): 12 nmol/L (ref 8–30)

## 2022-06-06 NOTE — Telephone Encounter (Signed)
Key: [id number]

## 2022-06-07 NOTE — Telephone Encounter (Signed)
Per CoverMyMeds PA was denied.  Reason given: "Your plan's Medicare Part D drug plan cannot cover  drugs used for loss of appetite (anorexia), weight loss, or weight gain."

## 2022-06-09 NOTE — Telephone Encounter (Signed)
[employer] sent PA on pt Steven Sanford. Completed questionnaire and faxed back [phone] along w/ pt last ov...Chryl Heck

## 2022-06-21 ENCOUNTER — Encounter (HOSPITAL_BASED_OUTPATIENT_CLINIC_OR_DEPARTMENT_OTHER): Payer: Self-pay | Admitting: Cardiology

## 2022-06-21 ENCOUNTER — Ambulatory Visit (INDEPENDENT_AMBULATORY_CARE_PROVIDER_SITE_OTHER): Payer: Medicare Other

## 2022-06-21 ENCOUNTER — Ambulatory Visit (HOSPITAL_BASED_OUTPATIENT_CLINIC_OR_DEPARTMENT_OTHER): Payer: Medicare Other | Admitting: Cardiology

## 2022-06-21 VITALS — BP 132/86 | HR 96 | Ht 71.5 in | Wt 227.6 lb

## 2022-06-21 DIAGNOSIS — R000 Tachycardia, unspecified: Secondary | ICD-10-CM | POA: Diagnosis not present

## 2022-06-21 DIAGNOSIS — M79604 Pain in right leg: Secondary | ICD-10-CM | POA: Diagnosis not present

## 2022-06-21 DIAGNOSIS — E785 Hyperlipidemia, unspecified: Secondary | ICD-10-CM | POA: Diagnosis not present

## 2022-06-21 DIAGNOSIS — R002 Palpitations: Secondary | ICD-10-CM

## 2022-06-21 DIAGNOSIS — I10 Essential (primary) hypertension: Secondary | ICD-10-CM

## 2022-06-21 DIAGNOSIS — R200 Anesthesia of skin: Secondary | ICD-10-CM

## 2022-06-21 DIAGNOSIS — M79605 Pain in left leg: Secondary | ICD-10-CM

## 2022-06-23 ENCOUNTER — Ambulatory Visit: Payer: Medicare Other | Admitting: Neurology

## 2022-06-23 ENCOUNTER — Encounter: Payer: Self-pay | Admitting: Neurology

## 2022-06-23 VITALS — BP 134/84 | HR 98 | Ht 69.0 in | Wt 210.0 lb

## 2022-06-23 DIAGNOSIS — R202 Paresthesia of skin: Secondary | ICD-10-CM | POA: Diagnosis not present

## 2022-06-23 NOTE — Progress Notes (Signed)
Chief Complaint  Patient presents with   New Patient (Initial Visit)    Rm 15, alone NP/Internal referral for neuropathy - BLE and LUE States neuropathy is worse in both feet and toes       ASSESSMENT AND PLAN  Steven Sanford is a 66 y.o. male    Bilateral feet paresthesia  Decreased vibratory sensation at toes, and absent Achilles reflex,  Differentiation diagnosis include small fiber neuropathy versus sensory loss related to his lumbar radiculopathy,  Complete laboratory evaluation to rule out treatable causes of peripheral neuropathy  Continue to observe his symptoms,  Only return to clinic for worsening symptoms  DIAGNOSTIC DATA (LABS, IMAGING, TESTING) - I reviewed patient records, labs, notes, testing and imaging myself where available.   MEDICAL HISTORY:  Steven Sanford, is a 66 year old male, seen in request by his primary care physician Dr. Scarlette Calico for evaluation of bilateral foot numbness, initial evaluation was on June 23, 2022  I reviewed and summarized the referring note.  PMHX. HLD HTN Obesity.  Around 2021, he noticed bottom of left foot numbness, starting at the ball of left foot, as if something was in his shoes, gradually spreading to involving toes, but stay at the plantar surface, in 2022, he noticed similar involvement of right foot,  He denies significant burning or pain, he denies gait abnormality, only intermittent low back neck pain, no major limitations, denies bowel and bladder incontinence  Personally reviewed MRI of cervical spine in August 2021, multilevel degenerative changes, moderate left foraminal stenosis at C6-7, severe at C7-T1, he does have intermittent numbness involving left third through fifth finger, no weakness  MRI of lumbar spine left foraminal disc protrusion with significant foraminal stenosis, at L3-4, L4-5, disc degeneration and endplate spurring M5-Y6 with moderate subarticular stenosis bilaterally  Laboratory  evaluation showed normal T03, B1, folic acid, TSH, T4S, CBC, PSA, CMP  He was seen by outside neurologist in the past, reported no significant abnormality on EMG nerve conduction study  He does drink hard liquor in small amount intermittently   PHYSICAL EXAM:   Vitals:   06/23/22 0835  BP: 134/84  Pulse: 98  Weight: 210 lb (95.3 kg)  Height: '5\' 9"'$  (1.753 m)   Not recorded     Body mass index is 31.01 kg/m.  PHYSICAL EXAMNIATION:  Gen: NAD, conversant, well nourised, well groomed                     Cardiovascular: Regular rate rhythm, no peripheral edema, warm, nontender. Eyes: Conjunctivae clear without exudates or hemorrhage Neck: Supple, no carotid bruits. Pulmonary: Clear to auscultation bilaterally   NEUROLOGICAL EXAM:  MENTAL STATUS: Speech/cognition: Awake, alert, oriented to history taking and casual conversation CRANIAL NERVES: CN II: Visual fields are full to confrontation. Pupils are round equal and briskly reactive to light. CN III, IV, VI: extraocular movement are normal. No ptosis. CN V: Facial sensation is intact to light touch CN VII: Face is symmetric with normal eye closure  CN VIII: Hearing is normal to causal conversation. CN IX, X: Phonation is normal. CN XI: Head turning and shoulder shrug are intact  MOTOR: There is no pronator drift of out-stretched arms. Muscle bulk and tone are normal. Muscle strength is normal.  REFLEXES: Reflexes are 2+ and symmetric at the biceps, triceps, knees, and ankles. Plantar responses are flexor.  SENSORY: Intact to light touch, pinprick and vibratory sensation are intact in fingers and toes.  COORDINATION: There is no trunk  or limb dysmetria noted.  GAIT/STANCE: Posture is normal. Gait is steady with normal steps, base, arm swing, and turning. Heel and toe walking are normal. Tandem gait is normal.  Romberg is absent.  REVIEW OF SYSTEMS:  Full 14 system review of systems performed and notable only for  as above All other review of systems were negative.   ALLERGIES: Allergies  Allergen Reactions   Penicillins Other (See Comments)    HOME MEDICATIONS: Current Outpatient Medications  Medication Sig Dispense Refill   atorvastatin (LIPITOR) 40 MG tablet Take 1 tablet (40 mg total) by mouth daily. 90 tablet 1   enalapril (VASOTEC) 10 MG tablet Take 1 tablet (10 mg total) by mouth daily. 90 tablet 1   [START ON 08/27/2022] Semaglutide-Weight Management 1.7 MG/0.75ML SOAJ Inject 1.7 mg into the skin once a week for 28 days. 3 mL 0   [START ON 09/25/2022] Semaglutide-Weight Management 2.4 MG/0.75ML SOAJ Inject 2.4 mg into the skin once a week for 28 days. 3 mL 0   tadalafil (CIALIS) 20 MG tablet Take 0.5-1 tablets (10-20 mg total) by mouth every other day as needed for erectile dysfunction. 6 tablet 3   No current facility-administered medications for this visit.    PAST MEDICAL HISTORY: Past Medical History:  Diagnosis Date   Hypertension     PAST SURGICAL HISTORY: History reviewed. No pertinent surgical history.  FAMILY HISTORY: Family History  Problem Relation Age of Onset   Alcoholism Mother    Alcoholism Father     SOCIAL HISTORY: Social History   Socioeconomic History   Marital status: Married    Spouse name: Not on file   Number of children: Not on file   Years of education: Not on file   Highest education level: Not on file  Occupational History   Not on file  Tobacco Use   Smoking status: Never    Passive exposure: Never   Smokeless tobacco: Never  Substance and Sexual Activity   Alcohol use: Yes    Alcohol/week: 10.0 standard drinks of alcohol    Types: 10 Shots of liquor per week   Drug use: Never   Sexual activity: Yes    Partners: Female  Other Topics Concern   Not on file  Social History Narrative   Not on file   Social Determinants of Health   Financial Resource Strain: Low Risk  (06/21/2022)   Overall Financial Resource Strain (CARDIA)     Difficulty of Paying Living Expenses: Not hard at all  Food Insecurity: No Food Insecurity (06/21/2022)   Hunger Vital Sign    Worried About Running Out of Food in the Last Year: Never true    Ran Out of Food in the Last Year: Never true  Transportation Needs: No Transportation Needs (06/21/2022)   PRAPARE - Hydrologist (Medical): No    Lack of Transportation (Non-Medical): No  Physical Activity: Insufficiently Active (06/21/2022)   Exercise Vital Sign    Days of Exercise per Week: 3 days    Minutes of Exercise per Session: 30 min  Stress: Not on file  Social Connections: Not on file  Intimate Partner Violence: Not on file      Marcial Pacas, M.D. Ph.D.  [facility] [address] [place], [place] [address] Ph: [phone] Fax: [phone]  CC:  Janith Lima, MD [place],  [place] [id number]  Janith Lima, MD

## 2022-06-24 ENCOUNTER — Ambulatory Visit (HOSPITAL_COMMUNITY)
Admission: RE | Admit: 2022-06-24 | Discharge: 2022-06-24 | Disposition: A | Discharge status: Home / Self Care | Payer: Medicare Other | Source: Ambulatory Visit | Attending: Cardiology | Admitting: Cardiology

## 2022-06-24 ENCOUNTER — Other Ambulatory Visit (HOSPITAL_BASED_OUTPATIENT_CLINIC_OR_DEPARTMENT_OTHER): Payer: Medicare Other

## 2022-06-24 DIAGNOSIS — E785 Hyperlipidemia, unspecified: Secondary | ICD-10-CM | POA: Insufficient documentation

## 2022-06-27 DIAGNOSIS — R002 Palpitations: Secondary | ICD-10-CM

## 2022-06-27 DIAGNOSIS — R000 Tachycardia, unspecified: Secondary | ICD-10-CM

## 2022-06-29 LAB — MULTIPLE MYELOMA PANEL, SERUM
Albumin SerPl Elph-Mcnc: 4.2 g/dL (ref 2.9–4.4)
Albumin/Glob SerPl: 1.5 (ref 0.7–1.7)
Alpha 1: 0.2 g/dL (ref 0.0–0.4)
Alpha2 Glob SerPl Elph-Mcnc: 0.8 g/dL (ref 0.4–1.0)
B-Globulin SerPl Elph-Mcnc: 1 g/dL (ref 0.7–1.3)
Gamma Glob SerPl Elph-Mcnc: 1 g/dL (ref 0.4–1.8)
Globulin, Total: 2.9 g/dL (ref 2.2–3.9)
IgA/Immunoglobulin A, Serum: 146 mg/dL (ref 61–437)
IgG (Immunoglobin G), Serum: 891 mg/dL (ref 603–1613)
IgM (Immunoglobulin M), Srm: 41 mg/dL (ref 20–172)
Total Protein: 7.1 g/dL (ref 6.0–8.5)

## 2022-06-29 LAB — C-REACTIVE PROTEIN: CRP: 1 mg/L (ref 0–10)

## 2022-06-29 LAB — ANA W/REFLEX IF POSITIVE: Anti Nuclear Antibody (ANA): NEGATIVE

## 2022-06-29 LAB — SEDIMENTATION RATE: Sed Rate: 13 mm/hr (ref 0–30)

## 2022-06-30 ENCOUNTER — Other Ambulatory Visit (HOSPITAL_BASED_OUTPATIENT_CLINIC_OR_DEPARTMENT_OTHER): Payer: Self-pay

## 2022-06-30 DIAGNOSIS — R911 Solitary pulmonary nodule: Secondary | ICD-10-CM

## 2022-07-03 NOTE — Progress Notes (Unsigned)
Cardiology Office Note:    Date:  07/03/2022   ID:  Steven Sanford, DOB Oct 28, 1956, MRN [id number]  PCP:  Janith Lima, MD  Cardiologist:  None  Electrophysiologist:  None   Referring MD: Janith Lima, MD   No chief complaint on file.   History of Present Illness:    Steven Sanford is a 66 y.o. male with a hx of hypertension, hyperlipidemia, obesity who presents for follow-up.  He was referred by Dr. Ronnald Ramp for evaluation of tachycardia and hypertension, initially seen on 06/21/2022.  He recently moved from [place].  He was recently told to stop his amlodipine but he thinks he is still taking.  He is unsure of his medications.  He denies any chest pain, dyspnea, lower extremity edema.  He walks daily for 20 minutes.  Reports some lightheadedness with standing but denies any syncope.  Has issues with leg pain/numbness.  Has been having palpitations but improved with decreasing caffeine intake.  He has recently been drinking more caffeine, up to 2 cups coffee per day and tea.  Reports he checks his pulse at home and will run over 100s at times.  Smoked cigarettes in high school but none since.  Smokes cigars, about 1 to 2/year.  No known family history of heart disease.  Calcium score 0 on 06/24/2022.  Calcium score also notable for small pulmonary nodules measuring up to 5 mm.  Since last clinic visit,  Past Medical History:  Diagnosis Date   Hypertension     No past surgical history on file.  Current Medications: No outpatient medications have been marked as taking for the 07/07/22 encounter (Appointment) with Donato Heinz, MD.     Allergies:   Penicillins   Social History   Socioeconomic History   Marital status: Married    Spouse name: Not on file   Number of children: Not on file   Years of education: Not on file   Highest education level: Not on file  Occupational History   Not on file  Tobacco Use   Smoking status: Never    Passive exposure: Never    Smokeless tobacco: Never  Substance and Sexual Activity   Alcohol use: Yes    Alcohol/week: 10.0 standard drinks of alcohol    Types: 10 Shots of liquor per week   Drug use: Never   Sexual activity: Yes    Partners: Female  Other Topics Concern   Not on file  Social History Narrative   Not on file   Social Determinants of Health   Financial Resource Strain: Low Risk  (06/21/2022)   Overall Financial Resource Strain (CARDIA)    Difficulty of Paying Living Expenses: Not hard at all  Food Insecurity: No Food Insecurity (06/21/2022)   Hunger Vital Sign    Worried About Running Out of Food in the Last Year: Never true    Ran Out of Food in the Last Year: Never true  Transportation Needs: No Transportation Needs (06/21/2022)   PRAPARE - Hydrologist (Medical): No    Lack of Transportation (Non-Medical): No  Physical Activity: Insufficiently Active (06/21/2022)   Exercise Vital Sign    Days of Exercise per Week: 3 days    Minutes of Exercise per Session: 30 min  Stress: Not on file  Social Connections: Not on file     Family History: The patient's family history includes Alcoholism in his father and mother.  ROS:   Please see the history  of present illness.     All other systems reviewed and are negative.  EKGs/Labs/Other Studies Reviewed:    The following studies were reviewed today:   EKG:   06/21/2022: Normal sinus rhythm, rate 96, low voltage, poor R wave progression  Recent Labs: 06/01/2022: ALT 25; BUN 15; Creatinine, Ser 1.04; Hemoglobin 14.7; Platelets 247.0; Potassium 4.7; Sodium 138; TSH 1.41  Recent Lipid Panel    Component Value Date/Time   CHOL 159 06/01/2022 0937   TRIG 79.0 06/01/2022 0937   HDL 63.70 06/01/2022 0937   CHOLHDL 2 06/01/2022 0937   VLDL 15.8 06/01/2022 0937   LDLCALC 79 06/01/2022 0937   LDLCALC 83 03/25/2021 0930    Physical Exam:    VS:  There were no vitals taken for this visit.    Wt Readings from  Last 3 Encounters:  06/23/22 210 lb (95.3 kg)  06/21/22 227 lb 9.6 oz (103.2 kg)  06/01/22 229 lb (103.9 kg)     GEN:  Well nourished, well developed in no acute distress HEENT: Normal NECK: No JVD; No carotid bruits LYMPHATICS: No lymphadenopathy CARDIAC: RRR, no murmurs, rubs, gallops RESPIRATORY:  Clear to auscultation without rales, wheezing or rhonchi  ABDOMEN: Soft, non-tender, non-distended MUSCULOSKELETAL:  No edema; No deformity  SKIN: Warm and dry NEUROLOGIC:  Alert and oriented x 3 PSYCHIATRIC:  Normal affect   ASSESSMENT:    No diagnosis found.  PLAN:    Tachycardia/palpitations: Reports resting heart rate has been over 100.  Also is having issues with palpitations but improved with decreasing caffeine use.  Will check Zio patch x3 days  Hypertension: On enalapril 10 mg daily.  He reports he has also been taking amlodipine at home but appears that this was discontinued at appointment with Dr. Ronnald Ramp on 6/9.  He is unsure of his medications.  Asked him to call and let us know what he is taking  Leg pain/numbness: Will check ABIs  Hyperlipidemia: On atorvastatin 40 mg daily.  LDL 79 on 06/01/22.  Calcium score 0 on 06/24/2022.    Obesity: Planning on starting Wegovy, awaiting insurance approval  Pulmonary nodules: Calcium score 06/24/2022 notable for small pulmonary nodules measuring up to 5 mm.  Plan repeat chest CT in 1 year to follow-up  RTC in 6 months  Medication Adjustments/Labs and Tests Ordered: Current medicines are reviewed at length with the patient today.  Concerns regarding medicines are outlined above.  No orders of the defined types were placed in this encounter.  No orders of the defined types were placed in this encounter.   There are no Patient Instructions on file for this visit.   Signed, Donato Heinz, MD  07/03/2022 8:43 PM    [place]

## 2022-07-07 ENCOUNTER — Ambulatory Visit: Payer: Medicare Other | Admitting: Cardiology

## 2022-07-07 ENCOUNTER — Encounter: Payer: Self-pay | Admitting: Cardiology

## 2022-07-07 VITALS — BP 132/70 | HR 102 | Ht 72.0 in | Wt 218.0 lb

## 2022-07-07 DIAGNOSIS — I10 Essential (primary) hypertension: Secondary | ICD-10-CM

## 2022-07-07 DIAGNOSIS — R000 Tachycardia, unspecified: Secondary | ICD-10-CM

## 2022-07-07 DIAGNOSIS — M79604 Pain in right leg: Secondary | ICD-10-CM

## 2022-07-07 DIAGNOSIS — E785 Hyperlipidemia, unspecified: Secondary | ICD-10-CM

## 2022-07-07 DIAGNOSIS — R918 Other nonspecific abnormal finding of lung field: Secondary | ICD-10-CM

## 2022-07-07 DIAGNOSIS — M79605 Pain in left leg: Secondary | ICD-10-CM

## 2022-07-07 NOTE — Patient Instructions (Addendum)
Medication Instructions:  Your physician recommends that you continue on your current medications as directed. Please refer to the Current Medication list given to you today.  *If you need a refill on your cardiac medications before your next appointment, please call your pharmacy*  Follow-Up: At [facility], you and your health needs are our priority.  As part of our continuing mission to provide you with exceptional heart care, we have created designated Provider Care Teams.  These Care Teams include your primary Cardiologist (physician) and Advanced Practice Providers (APPs -  Physician Assistants and Nurse Practitioners) who all work together to provide you with the care you need, when you need it.  We recommend signing up for the patient portal called "MyChart".  Sign up information is provided on this After Visit Summary.  MyChart is used to connect with patients for Virtual Visits (Telemedicine).  Patients are able to view lab/test results, encounter notes, upcoming appointments, etc.  Non-urgent messages can be sent to your provider as well.   To learn more about what you can do with MyChart, go to [email or web address].    Your next appointment:   As planned in December with Dr. Gardiner Rhyme    Important Information About Sugar

## 2022-07-15 ENCOUNTER — Ambulatory Visit (INDEPENDENT_AMBULATORY_CARE_PROVIDER_SITE_OTHER): Payer: Medicare Other

## 2022-07-15 DIAGNOSIS — M79604 Pain in right leg: Secondary | ICD-10-CM | POA: Diagnosis not present

## 2022-07-15 DIAGNOSIS — R200 Anesthesia of skin: Secondary | ICD-10-CM

## 2022-07-15 DIAGNOSIS — M79605 Pain in left leg: Secondary | ICD-10-CM | POA: Diagnosis not present

## 2022-07-19 ENCOUNTER — Encounter: Payer: Self-pay | Admitting: Internal Medicine

## 2022-07-20 ENCOUNTER — Other Ambulatory Visit: Payer: Self-pay | Admitting: Internal Medicine

## 2022-07-20 MED ORDER — ONDANSETRON HCL 4 MG PO TABS: 4.0000 mg | ORAL_TABLET | Freq: Three times a day (TID) | ORAL | 1 refills | Status: DC | PRN

## 2022-07-20 NOTE — Telephone Encounter (Signed)
Patient called back and would like the zofran rx sent in today.  He does not want to take the [facility] until he has the zofran because he know he will be nauseated.

## 2022-07-22 ENCOUNTER — Encounter: Payer: Self-pay | Admitting: Gastroenterology

## 2022-07-26 ENCOUNTER — Ambulatory Visit: Payer: Medicare Other | Admitting: Internal Medicine

## 2022-08-05 ENCOUNTER — Encounter: Payer: Self-pay | Admitting: Internal Medicine

## 2022-08-05 ENCOUNTER — Other Ambulatory Visit: Payer: Self-pay | Admitting: Internal Medicine

## 2022-08-05 DIAGNOSIS — E6609 Other obesity due to excess calories: Secondary | ICD-10-CM

## 2022-08-05 MED ORDER — SEMAGLUTIDE-WEIGHT MANAGEMENT 2.4 MG/0.75ML ~~LOC~~ SOAJ: 2.4000 mg | SUBCUTANEOUS | 1 refills | Status: AC

## 2022-08-17 ENCOUNTER — Encounter: Payer: Self-pay | Admitting: Gastroenterology

## 2022-08-17 ENCOUNTER — Ambulatory Visit: Payer: Medicare Other | Admitting: Gastroenterology

## 2022-08-17 VITALS — BP 110/80 | HR 92 | Ht 72.0 in | Wt 204.4 lb

## 2022-08-17 DIAGNOSIS — K635 Polyp of colon: Secondary | ICD-10-CM | POA: Diagnosis not present

## 2022-08-17 DIAGNOSIS — K219 Gastro-esophageal reflux disease without esophagitis: Secondary | ICD-10-CM | POA: Diagnosis not present

## 2022-08-17 DIAGNOSIS — Z7189 Other specified counseling: Secondary | ICD-10-CM | POA: Diagnosis not present

## 2022-08-17 DIAGNOSIS — K2270 Barrett's esophagus without dysplasia: Secondary | ICD-10-CM

## 2022-08-17 MED ORDER — CLENPIQ 10-3.5-12 MG-GM -GM/175ML PO SOLN: 1.0000 | Freq: Once | ORAL | 0 refills | Status: AC

## 2022-08-17 NOTE — Patient Instructions (Addendum)
_______________________________________________________ If you are age 68 or older, your body mass index should be between 23-30. Your Body mass index is 27.72 kg/m. If this is out of the aforementioned range listed, please consider follow up with your Primary Care Provider. ________________________________________________________  The [facility] providers would like to encourage you to use [facility] to communicate with providers for non-urgent requests or questions.  Due to long hold times on the telephone, sending your provider a message by [facility] may be a faster and more efficient way to get a response.  Please allow 48 business hours for a response.  Please remember that this is for non-urgent requests.  _______________________________________________________  Due to recent changes in healthcare laws, you may see the results of your imaging and laboratory studies on MyChart before your provider has had a chance to review them.  We understand that in some cases there may be results that are confusing or concerning to you. Not all laboratory results come back in the same time frame and the provider may be waiting for multiple results in order to interpret others.  Please give [place] 48 hours in order for your provider to thoroughly review all the results before contacting the office for clarification of your results.    You have been scheduled for an endoscopy and colonoscopy. Please follow the written instructions given to you at your visit today. Please pick up your prep supplies at the pharmacy within the next 1-3 days. If you use inhalers (even only as needed), please bring them with you on the day of your procedure.   We have sent the following medications to your pharmacy for you to pick up at your convenience: Clenpiq  Thank you for choosing me and [facility].  Vito Cirigliano, D.O.

## 2022-08-17 NOTE — Progress Notes (Signed)
Chief Complaint: GERD, colon cancer screening   Referring Provider:     Janith Lima, MD   HPI:     Steven Sanford is a 66 y.o. male with history of HTN, HLD, referred to the Gastroenterology Clinic for evaluation of GERD and colon cancer screening.  Recently moved from [place] about 1.5 years ago.  Long-standing hx of GERD.  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  Reflux was well controlled with Dexilant 60 mg/day, but he has since titreated off.  Previously treated with Nexium, but with regular breakthrough symptoms.  No recent breakthrough sxs. Last EGD was ~4 years ago and was told to repeat EGD due to c/f Barrett's and ongoing surveillance.  Last colonoscopy was ~5 years ago and recommended repeat in 5 years due to hx of polyps. No records available for review today.  No lower GI symptoms.  Currently on Wegovy.     Latest Ref Rng & Units 06/01/2022    9:37 AM 03/25/2021    9:30 AM  CBC  WBC 4.0 - 10.5 K/uL 6.6  6.0   Hemoglobin 13.0 - 17.0 g/dL 14.7  15.2   Hematocrit 39.0 - 52.0 % 44.3  46.8   Platelets 150.0 - 400.0 K/uL 247.0  275       Latest Ref Rng & Units 06/23/2022    9:17 AM 06/01/2022    9:37 AM 03/25/2021    9:30 AM  CMP  Glucose 70 - 99 mg/dL  101  91   BUN 6 - 23 mg/dL  15  16   Creatinine 0.40 - 1.50 mg/dL  1.04  1.06   Sodium 135 - 145 mEq/L  138  139   Potassium 3.5 - 5.1 mEq/L  4.7  4.4   Chloride 96 - 112 mEq/L  100  102   CO2 19 - 32 mEq/L  29  27   Calcium 8.4 - 10.5 mg/dL  9.4  9.5   Total Protein 6.0 - 8.5 g/dL 7.1  6.8  7.5    7.2   Total Bilirubin 0.2 - 1.2 mg/dL  0.6  0.7   Alkaline Phos 39 - 117 U/L  55    AST 0 - 37 U/L  18  21   ALT 0 - 53 U/L  25  29       Past Medical History:  Diagnosis Date   GERD (gastroesophageal reflux disease)    Hypertension      Past Surgical History:  Procedure Laterality Date   EYE SURGERY Left 2022   5 operations in total   Family History   Problem Relation Age of Onset   Alcoholism Mother    Alcoholism Father    Colon cancer Neg Hx    Esophageal cancer Neg Hx    Liver cancer Neg Hx    Pancreatic cancer Neg Hx    Rectal cancer Neg Hx    Stomach cancer Neg Hx    Social History   Tobacco Use   Smoking status: Never    Passive exposure: Never   Smokeless tobacco: Never  Substance Use Topics   Alcohol use: Not Currently    Alcohol/week: 2.0 - 4.0 standard drinks of alcohol    Types: 2 - 4 Standard drinks or equivalent per week   Drug use: Never   Current Outpatient Medications  Medication Sig Dispense Refill   amLODipine (NORVASC) 5 MG tablet  Take 5 mg by mouth daily.     atorvastatin (LIPITOR) 40 MG tablet Take 1 tablet (40 mg total) by mouth daily. 90 tablet 1   enalapril (VASOTEC) 10 MG tablet Take 1 tablet (10 mg total) by mouth daily. 90 tablet 1   ondansetron (ZOFRAN) 4 MG tablet Take 1 tablet (4 mg total) by mouth every 8 (eight) hours as needed for nausea or vomiting. 20 tablet 1   [START ON 09/25/2022] Semaglutide-Weight Management 2.4 MG/0.75ML SOAJ Inject 2.4 mg into the skin once a week for 28 days. 12 mL 1   tadalafil (CIALIS) 20 MG tablet Take 0.5-1 tablets (10-20 mg total) by mouth every other day as needed for erectile dysfunction. 6 tablet 3   No current facility-administered medications for this visit.   Allergies  Allergen Reactions   Penicillins Other (See Comments)     Review of Systems: All systems reviewed and negative except where noted in HPI.     Physical Exam:    Wt Readings from Last 3 Encounters:  08/17/22 204 lb 6.4 oz (92.7 kg)  07/07/22 218 lb (98.9 kg)  06/23/22 210 lb (95.3 kg)    BP 110/80 (BP Location: Right Arm, Patient Position: Sitting, Cuff Size: Normal)   Pulse 92   Ht 6' (1.829 m)   Wt 204 lb 6.4 oz (92.7 kg)   SpO2 98%   BMI 27.72 kg/m  Constitutional:  Pleasant, in no acute distress. Psychiatric: Normal mood and affect. Behavior is  normal. Cardiovascular: Normal rate, regular rhythm. No edema Pulmonary/chest: Effort normal and breath sounds normal. No wheezing, rales or rhonchi. Abdominal: Soft, nondistended, nontender. Bowel sounds active throughout. There are no masses palpable. No hepatomegaly. Neurological: Alert and oriented to person place and time. Skin: Skin is warm and dry. No rashes noted.   ASSESSMENT AND PLAN;   1) GERD 2) Nondysplastic Barrett's Esophagus - EGD for ongoing Barrett's Esophagus surveillance along with evaluation for erosive esophagitis, hiatal hernia - Continue antireflux lifestyle/dietary modifications - Not currently being treated with any acid suppression.  We will reevaluate role/utility of restarting PPI based on endoscopic findings  3) History of colon polyps - Repeat colonoscopy for ongoing polyp surveillance  4) Medication counseling - Hold Wegovy 1 week prior to procedures  The indications, risks, and benefits of EGD and colonoscopy were explained to the patient in detail. Risks include but are not limited to bleeding, perforation, adverse reaction to medications, and cardiopulmonary compromise. Sequelae include but are not limited to the possibility of surgery, hospitalization, and mortality. The patient verbalized understanding and wished to proceed. All questions answered, referred to scheduler and bowel prep ordered. Further recommendations pending results of the exam.       Lavena Bullion, DO, FACG  08/17/2022, 9:29 AM   Janith Lima, MD

## 2022-08-28 ENCOUNTER — Encounter: Payer: Self-pay | Admitting: Certified Registered Nurse Anesthetist

## 2022-09-01 ENCOUNTER — Encounter: Payer: Self-pay | Admitting: Gastroenterology

## 2022-09-01 ENCOUNTER — Ambulatory Visit (AMBULATORY_SURGERY_CENTER): Payer: Medicare Other | Admitting: Gastroenterology

## 2022-09-01 VITALS — BP 115/58 | HR 101 | Temp 98.4°F | Resp 13 | Ht 72.0 in | Wt 204.0 lb

## 2022-09-01 DIAGNOSIS — K2960 Other gastritis without bleeding: Secondary | ICD-10-CM

## 2022-09-01 DIAGNOSIS — K219 Gastro-esophageal reflux disease without esophagitis: Secondary | ICD-10-CM

## 2022-09-01 DIAGNOSIS — K2270 Barrett's esophagus without dysplasia: Secondary | ICD-10-CM

## 2022-09-01 DIAGNOSIS — Z86010 Personal history of colonic polyps: Secondary | ICD-10-CM

## 2022-09-01 DIAGNOSIS — Z09 Encounter for follow-up examination after completed treatment for conditions other than malignant neoplasm: Secondary | ICD-10-CM

## 2022-09-01 DIAGNOSIS — K635 Polyp of colon: Secondary | ICD-10-CM

## 2022-09-01 DIAGNOSIS — K2100 Gastro-esophageal reflux disease with esophagitis, without bleeding: Secondary | ICD-10-CM | POA: Diagnosis not present

## 2022-09-01 DIAGNOSIS — K319 Disease of stomach and duodenum, unspecified: Secondary | ICD-10-CM | POA: Diagnosis not present

## 2022-09-01 DIAGNOSIS — K2970 Gastritis, unspecified, without bleeding: Secondary | ICD-10-CM

## 2022-09-01 DIAGNOSIS — K640 First degree hemorrhoids: Secondary | ICD-10-CM

## 2022-09-01 DIAGNOSIS — D125 Benign neoplasm of sigmoid colon: Secondary | ICD-10-CM

## 2022-09-01 MED ORDER — SODIUM CHLORIDE 0.9 % IV SOLN: 500.0000 mL | Freq: Once | INTRAVENOUS | Status: DC

## 2022-09-01 NOTE — Progress Notes (Signed)
Report given to PACU, vss 

## 2022-09-01 NOTE — Progress Notes (Signed)
Called to room to assist during endoscopic procedure.  Patient ID and intended procedure confirmed with present staff. Received instructions for my participation in the procedure from the performing physician.  

## 2022-09-01 NOTE — Progress Notes (Signed)
1324 Robinul 0.1 mg IV given due large amount of secretions upon assessment.  MD made aware, vss 

## 2022-09-01 NOTE — Patient Instructions (Signed)
Handouts on gastritis, polyps, and hemorrhoids given to you today   YOU HAD AN ENDOSCOPIC PROCEDURE TODAY AT [facility]:   Refer to the procedure report that was given to you for any specific questions about what was found during the examination.  If the procedure report does not answer your questions, please call your gastroenterologist to clarify.  If you requested that your care partner not be given the details of your procedure findings, then the procedure report has been included in a sealed envelope for you to review at your convenience later.  YOU SHOULD EXPECT: Some feelings of bloating in the abdomen. Passage of more gas than usual.  Walking can help get rid of the air that was put into your GI tract during the procedure and reduce the bloating. If you had a lower endoscopy (such as a colonoscopy or flexible sigmoidoscopy) you may notice spotting of blood in your stool or on the toilet paper. If you underwent a bowel prep for your procedure, you may not have a normal bowel movement for a few days.  Please Note:  You might notice some irritation and congestion in your nose or some drainage.  This is from the oxygen used during your procedure.  There is no need for concern and it should clear up in a day or so.  SYMPTOMS TO REPORT IMMEDIATELY:  Following lower endoscopy (colonoscopy or flexible sigmoidoscopy):  Excessive amounts of blood in the stool  Significant tenderness or worsening of abdominal pains  Swelling of the abdomen that is new, acute  Fever of 100F or higher  Following upper endoscopy (EGD)  Vomiting of blood or coffee ground material  New chest pain or pain under the shoulder blades  Painful or persistently difficult swallowing  New shortness of breath  Fever of 100F or higher  Black, tarry-looking stools  For urgent or emergent issues, a gastroenterologist can be reached at any hour by calling [phone]. Do not use MyChart messaging for urgent  concerns.    DIET:  We do recommend a small meal at first, but then you may proceed to your regular diet.  Drink plenty of fluids but you should avoid alcoholic beverages for 24 hours.  ACTIVITY:  You should plan to take it easy for the rest of today and you should NOT DRIVE or use heavy machinery until tomorrow (because of the sedation medicines used during the test).    FOLLOW UP: Our staff will call the number listed on your records the next business day following your procedure.  We will call around 7:15- 8:00 am to check on you and address any questions or concerns that you may have regarding the information given to you following your procedure. If we do not reach you, we will leave a message.  If you develop any symptoms (ie: fever, flu-like symptoms, shortness of breath, cough etc.) before then, please call [phone].  If you test positive for Covid 19 in the 2 weeks post procedure, please call and report this information to [place].    If any biopsies were taken you will be contacted by phone or by letter within the next 1-3 weeks.  Please call us at [phone] if you have not heard about the biopsies in 3 weeks.    SIGNATURES/CONFIDENTIALITY: You and/or your care partner have signed paperwork which will be entered into your electronic medical record.  These signatures attest to the fact that that the information above on your After Visit Summary  has been reviewed and is understood.  Full responsibility of the confidentiality of this discharge information lies with you and/or your care-partner.  

## 2022-09-01 NOTE — Progress Notes (Signed)
1335 HR > 100 with esmolol 25 mg given IV, MD updated, vss  

## 2022-09-01 NOTE — Op Note (Signed)
[name] Patient Name: Steven Sanford Procedure Date: 09/01/2022 1:31 PM MRN: [id number] Endoscopist: Gerrit Heck , MD Age: 66 Referring MD:  Date of Birth: 1956/03/06 Gender: Male Account #: [id number] Procedure:                Colonoscopy Indications:              Surveillance: Personal history of colonic polyps                            (unknown histology) on last colonoscopy 5 years ago Medicines:                Monitored Anesthesia Care Procedure:                Pre-Anesthesia Assessment:                           - Prior to the procedure, a History and Physical                            was performed, and patient medications and                            allergies were reviewed. The patient's tolerance of                            previous anesthesia was also reviewed. The risks                            and benefits of the procedure and the sedation                            options and risks were discussed with the patient.                            All questions were answered, and informed consent                            was obtained. Prior Anticoagulants: The patient has                            taken no previous anticoagulant or antiplatelet                            agents. ASA Grade Assessment: II - A patient with                            mild systemic disease. After reviewing the risks                            and benefits, the patient was deemed in                            satisfactory condition to undergo the procedure.  After obtaining informed consent, the colonoscope                            was passed under direct vision. Throughout the                            procedure, the patient's blood pressure, pulse, and                            oxygen saturations were monitored continuously. The                            CF [id number] #[id number] was introduced through the anus                            and  advanced to the the cecum, identified by                            appendiceal orifice and ileocecal valve. The                            colonoscopy was technically difficult and complex                            due to retained solid food debris (seeds) that                            clogged the colonoscope several times. Successful                            completion of the procedure was aided by changing                            colonoscopes and copious irrigation. The patient                            tolerated the procedure well. The quality of the                            bowel preparation was adequate. The ileocecal                            valve, appendiceal orifice, and rectum were                            photographed. Scope In: 1:40:58 PM Scope Out: 2:04:16 PM Scope Withdrawal Time: 0 hours 12 minutes 4 seconds  Total Procedure Duration: 0 hours 23 minutes 18 seconds  Findings:                 The perianal and digital rectal examinations were                            normal.  Solid food debris (ie, seeds) was found in the                            sigmoid colon, in the ascending colon, and in the                            cecum. This clogged the colonoscope and could not                            be cleared via manual brushing and lavage,                            requiring exchange for a new scope. Lavage of the                            area was performed using copious amounts of tap                            water, resulting in clearance with adequate                            visualization.                           A 4 mm polyp was found in the sigmoid colon. The                            polyp was sessile. The polyp was removed with a                            cold snare. Resection and retrieval were complete.                            Estimated blood loss was minimal.                           Non-bleeding internal  hemorrhoids were found during                            retroflexion. The hemorrhoids were small. Complications:            No immediate complications. Estimated Blood Loss:     Estimated blood loss was minimal. Impression:               - Solid food debris in the sigmoid colon, in the                            ascending colon and in the cecum.                           - One 4 mm polyp in the sigmoid colon, removed with                            a  cold snare. Resected and retrieved.                           - Non-bleeding internal hemorrhoids. Recommendation:           - Patient has a contact number available for                            emergencies. The signs and symptoms of potential                            delayed complications were discussed with the                            patient. Return to normal activities tomorrow.                            Written discharge instructions were provided to the                            patient.                           - Resume previous diet.                           - Continue present medications.                           - Await pathology results.                           - Repeat colonoscopy in 5 years for surveillance.                           - Return to GI clinic PRN. Gerrit Heck, MD 09/01/2022 2:18:42 PM

## 2022-09-01 NOTE — Progress Notes (Signed)
GASTROENTEROLOGY PROCEDURE H&P NOTE   Primary Care Physician: Janith Lima, MD    Reason for Procedure:  GERD, Barrett's Esophagus surveillance.  Colon cancer screening, colon polyp surveillance  Plan:    EGD, colonoscopy  Patient is appropriate for endoscopic procedure(s) in the ambulatory ([place] [facility]) setting.  The nature of the procedure, as well as the risks, benefits, and alternatives were carefully and thoroughly reviewed with the patient. Ample time for discussion and questions allowed. The patient understood, was satisfied, and agreed to proceed.     HPI: Steven Sanford is a 66 y.o. male who presents for EGD for evaluation of GERD and Barrett's Esophagus surveillance along with colonoscopy for ongoing polyp surveillance.  Patient was most recently seen in the Gastroenterology Clinic on 08/17/2022 by me.  No interval change in medical history since that appointment. Please refer to that note for full details regarding GI history and clinical presentation.   Holding [place] for procedures today.  Past Medical History:  Diagnosis Date   GERD (gastroesophageal reflux disease)    Hypertension     Past Surgical History:  Procedure Laterality Date   EYE SURGERY Left 2022   5 operations in total    Prior to Admission medications   Medication Sig Start Date End Date Taking? Authorizing Provider  amLODipine (NORVASC) 5 MG tablet Take 5 mg by mouth daily.   Yes [provider]  atorvastatin (LIPITOR) 40 MG tablet Take 1 tablet (40 mg total) by mouth daily. 06/03/22  Yes Janith Lima, MD  enalapril (VASOTEC) 10 MG tablet Take 1 tablet (10 mg total) by mouth daily. 06/03/22  Yes Janith Lima, MD  ondansetron (ZOFRAN) 4 MG tablet Take 1 tablet (4 mg total) by mouth every 8 (eight) hours as needed for nausea or vomiting. 07/20/22  Yes Janith Lima, MD  Semaglutide-Weight Management 2.4 MG/0.75ML SOAJ Inject 2.4 mg into the skin once a week for 28 days. 09/25/22 10/23/22   Janith Lima, MD  tadalafil (CIALIS) 20 MG tablet Take 0.5-1 tablets (10-20 mg total) by mouth every other day as needed for erectile dysfunction. Patient not taking: Reported on 09/01/2022 06/01/22   Janith Lima, MD    Current Outpatient Medications  Medication Sig Dispense Refill   amLODipine (NORVASC) 5 MG tablet Take 5 mg by mouth daily.     atorvastatin (LIPITOR) 40 MG tablet Take 1 tablet (40 mg total) by mouth daily. 90 tablet 1   enalapril (VASOTEC) 10 MG tablet Take 1 tablet (10 mg total) by mouth daily. 90 tablet 1   ondansetron (ZOFRAN) 4 MG tablet Take 1 tablet (4 mg total) by mouth every 8 (eight) hours as needed for nausea or vomiting. 20 tablet 1   [START ON 09/25/2022] Semaglutide-Weight Management 2.4 MG/0.75ML SOAJ Inject 2.4 mg into the skin once a week for 28 days. 12 mL 1   tadalafil (CIALIS) 20 MG tablet Take 0.5-1 tablets (10-20 mg total) by mouth every other day as needed for erectile dysfunction. (Patient not taking: Reported on 09/01/2022) 6 tablet 3   Current Facility-Administered Medications  Medication Dose Route Frequency Provider Last Rate Last Admin   0.9 %  sodium chloride infusion  500 mL Intravenous Once Gladis Soley V, DO        Allergies as of 09/01/2022 - Review Complete 09/01/2022  Allergen Reaction Noted   Penicillins Other (See Comments) 04/01/2016    Family History  Problem Relation Age of Onset   Alcoholism Mother  Alcoholism Father    Colon cancer Neg Hx    Esophageal cancer Neg Hx    Liver cancer Neg Hx    Pancreatic cancer Neg Hx    Rectal cancer Neg Hx    Stomach cancer Neg Hx     Social History   Socioeconomic History   Marital status: Married    Spouse name: Not on file   Number of children: Not on file   Years of education: Not on file   Highest education level: Not on file  Occupational History   Not on file  Tobacco Use   Smoking status: Never    Passive exposure: Never   Smokeless tobacco: Never  Substance  and Sexual Activity   Alcohol use: Not Currently    Alcohol/week: 2.0 - 4.0 standard drinks of alcohol    Types: 2 - 4 Standard drinks or equivalent per week   Drug use: Never   Sexual activity: Yes    Partners: Female  Other Topics Concern   Not on file  Social History Narrative   Not on file   Social Determinants of Health   Financial Resource Strain: Low Risk  (06/21/2022)   Overall Financial Resource Strain (CARDIA)    Difficulty of Paying Living Expenses: Not hard at all  Food Insecurity: No Food Insecurity (06/21/2022)   Hunger Vital Sign    Worried About Running Out of Food in the Last Year: Never true    Ran Out of Food in the Last Year: Never true  Transportation Needs: No Transportation Needs (06/21/2022)   PRAPARE - Hydrologist (Medical): No    Lack of Transportation (Non-Medical): No  Physical Activity: Insufficiently Active (06/21/2022)   Exercise Vital Sign    Days of Exercise per Week: 3 days    Minutes of Exercise per Session: 30 min  Stress: Not on file  Social Connections: Not on file  Intimate Partner Violence: Not on file    Physical Exam: Vital signs in last 24 hours: '@BP'$  114/61   Pulse 99   Temp 98.4 F (36.9 C) (Skin)   Ht 6' (1.829 m)   Wt 204 lb (92.5 kg)   SpO2 95%   BMI 27.67 kg/m  GEN: NAD EYE: Sclerae anicteric ENT: MMM CV: Non-tachycardic Pulm: CTA b/l GI: Soft, NT/ND NEURO:  Alert & Oriented x 3   Gerrit Heck, DO [facility]   09/01/2022 1:24 PM

## 2022-09-01 NOTE — Progress Notes (Signed)
Pt's states no medical or surgical changes since previsit or office visit. 

## 2022-09-01 NOTE — Progress Notes (Signed)
1340 HR > 100 with esmolol 25 mg given IV, MD updated, vss  

## 2022-09-01 NOTE — Op Note (Signed)
[facility] Patient Name: Steven Sanford Procedure Date: 09/01/2022 1:31 PM MRN: [id number] Endoscopist: Gerrit Heck , MD Age: 65 Referring MD:  Date of Birth: 05-Aug-1956 Gender: Male Account #: [id number] Procedure:                Upper GI endoscopy Indications:              Esophageal reflux, Surveillance for malignancy due                            to personal history of Barrett's esophagus                           Last EGD was approximately 4-5 years ago at outside                            facility and notable for non-dysplastic Barrett's                            Esophagus. Medicines:                Monitored Anesthesia Care Procedure:                Pre-Anesthesia Assessment:                           - Prior to the procedure, a History and Physical                            was performed, and patient medications and                            allergies were reviewed. The patient's tolerance of                            previous anesthesia was also reviewed. The risks                            and benefits of the procedure and the sedation                            options and risks were discussed with the patient.                            All questions were answered, and informed consent                            was obtained. Prior Anticoagulants: The patient has                            taken no previous anticoagulant or antiplatelet                            agents. ASA Grade Assessment: II - A patient with  mild systemic disease. After reviewing the risks                            and benefits, the patient was deemed in                            satisfactory condition to undergo the procedure.                           After obtaining informed consent, the endoscope was                            passed under direct vision. Throughout the                            procedure, the patient's blood pressure, pulse, and                             oxygen saturations were monitored continuously. The                            Endoscope was introduced through the mouth, and                            advanced to the second part of duodenum. The upper                            GI endoscopy was accomplished without difficulty.                            The patient tolerated the procedure well. Scope In: Scope Out: Findings:                 A single area of ectopic gastric mucosa was found                            in the upper third of the esophagus.                           The Z-line was slightly irregular with a 0.5 cm                            tongue of salmon colored mucosa located at 42 cm                            from the incisors. Given reported history of                            Barrett's Esophagus, biopsies were taken with a                            cold forceps for histology. Estimated blood loss  was minimal.                           The gastroesophageal flap valve was visualized                            endoscopically and classified as Hill Grade II                            (fold present, opens with respiration).                           Localized mild inflammation characterized by                            congestion (edema) and erythema was found in the                            prepyloric region of the stomach. Biopsies were                            taken with a cold forceps for histology. Estimated                            blood loss was minimal.                           The gastric fundus, gastric body and incisura were                            normal.                           The examined duodenum was normal. Complications:            No immediate complications. Estimated Blood Loss:     Estimated blood loss was minimal. Impression:               - Ectopic gastric mucosa in the upper third of the                            esophagus.                            - Z-line irregular, 42 cm from the incisors.                            Biopsied.                           - Gastroesophageal flap valve classified as Hill                            Grade II (fold present, opens with respiration).                           - Gastritis. Biopsied.                           -  Normal gastric fundus, gastric body and incisura.                           - Normal examined duodenum. Recommendation:           - Patient has a contact number available for                            emergencies. The signs and symptoms of potential                            delayed complications were discussed with the                            patient. Return to normal activities tomorrow.                            Written discharge instructions were provided to the                            patient.                           - Resume previous diet.                           - Continue present medications.                           - Await pathology results.                           - Repeat upper endoscopy for surveillance based on                            pathology results. Gerrit Heck, MD 09/01/2022 2:13:07 PM

## 2022-09-02 ENCOUNTER — Telehealth: Payer: Self-pay

## 2022-09-02 NOTE — Telephone Encounter (Signed)
Left message

## 2022-09-07 ENCOUNTER — Encounter: Payer: Self-pay | Admitting: Gastroenterology

## 2022-09-23 ENCOUNTER — Telehealth: Payer: Self-pay

## 2022-09-23 NOTE — Telephone Encounter (Signed)
Patient called in stating that he has noticed his blood pressure has been low, believes it may be from losing some weight recently. Averaging bp is 108/66, lowest it has been is 100/65 and will get dizzy spells if standing up to fast. Advised to schedule an appointment but Juanda Crumble said he will cut out the amlodipine as he has been through this before and doesn't wish to come in for an appointment.

## 2022-11-29 NOTE — Progress Notes (Deleted)
Cardiology Office Note:    Date:  11/29/2022   ID:  Steven Sanford, DOB [birth year], MRN [id number]  PCP:  Janith Lima, MD  Cardiologist:  None  Electrophysiologist:  None   Referring MD: Janith Lima, MD   No chief complaint on file.   History of Present Illness:    Steven Sanford is a 66 y.o. male with a hx of hypertension, hyperlipidemia, obesity who presents for follow-up.  He was referred by Dr. Ronnald Ramp for evaluation of tachycardia and hypertension, initially seen on 06/21/2022.  He recently moved from [place].  He was recently told to stop his amlodipine but he thinks he is still taking.  He is unsure of his medications.  He denies any chest pain, dyspnea, lower extremity edema.  He walks daily for 20 minutes.  Reports some lightheadedness with standing but denies any syncope.  Has issues with leg pain/numbness.  Has been having palpitations but improved with decreasing caffeine intake.  He has recently been drinking more caffeine, up to 2 cups coffee per day and tea.  Reports he checks his pulse at home and will run over 100s at times.  Smoked cigarettes in high school but none since.  Smokes cigars, about 1 to 2/year.  No known family history of heart disease.  Calcium score 0 on 06/24/2022.  Calcium score also notable for small pulmonary nodules measuring up to 5 mm.  Zio patch x8 days on 07/13/2022 showed 12 episodes of SVT, longest lasting 19 beats.  Since last clinic visit,  he reports that he has been doing well.  Reports he continues to have tachycardia, he turned in his ZIO monitor this week but results are not back yet.  He denies any chest pain or dyspnea.  Continues to have numbness in his feet.  Past Medical History:  Diagnosis Date   GERD (gastroesophageal reflux disease)    Hypertension     Past Surgical History:  Procedure Laterality Date   EYE SURGERY Left 2022   5 operations in total    Current Medications: No outpatient medications have been marked  as taking for the 11/30/22 encounter (Appointment) with Donato Heinz, MD.     Allergies:   Penicillins   Social History   Socioeconomic History   Marital status: Married    Spouse name: Not on file   Number of children: Not on file   Years of education: Not on file   Highest education level: Not on file  Occupational History   Not on file  Tobacco Use   Smoking status: Never    Passive exposure: Never   Smokeless tobacco: Never  Substance and Sexual Activity   Alcohol use: Not Currently    Alcohol/week: 2.0 - 4.0 standard drinks of alcohol    Types: 2 - 4 Standard drinks or equivalent per week   Drug use: Never   Sexual activity: Yes    Partners: Female  Other Topics Concern   Not on file  Social History Narrative   Not on file   Social Determinants of Health   Financial Resource Strain: Low Risk  (06/21/2022)   Overall Financial Resource Strain (CARDIA)    Difficulty of Paying Living Expenses: Not hard at all  Food Insecurity: No Food Insecurity (06/21/2022)   Hunger Vital Sign    Worried About Running Out of Food in the Last Year: Never true    Ran Out of Food in the Last Year: Never true  Transportation Needs: No Transportation  Needs (06/21/2022)   PRAPARE - Hydrologist (Medical): No    Lack of Transportation (Non-Medical): No  Physical Activity: Insufficiently Active (06/21/2022)   Exercise Vital Sign    Days of Exercise per Week: 3 days    Minutes of Exercise per Session: 30 min  Stress: Not on file  Social Connections: Not on file     Family History: The patient's family history includes Alcoholism in his father and mother. There is no history of Colon cancer, Esophageal cancer, Liver cancer, Pancreatic cancer, Rectal cancer, or Stomach cancer.  ROS:   Please see the history of present illness.     All other systems reviewed and are negative.  EKGs/Labs/Other Studies Reviewed:    The following studies were reviewed  today:   EKG:   06/21/2022: Normal sinus rhythm, rate 96, low voltage, poor R wave progression  Recent Labs: 06/01/2022: ALT 25; BUN 15; Creatinine, Ser 1.04; Hemoglobin 14.7; Platelets 247.0; Potassium 4.7; Sodium 138; TSH 1.41  Recent Lipid Panel    Component Value Date/Time   CHOL 159 06/01/2022 0937   TRIG 79.0 06/01/2022 0937   HDL 63.70 06/01/2022 0937   CHOLHDL 2 06/01/2022 0937   VLDL 15.8 06/01/2022 0937   LDLCALC 79 06/01/2022 0937   LDLCALC 83 03/25/2021 0930    Physical Exam:    VS:  There were no vitals taken for this visit.    Wt Readings from Last 3 Encounters:  09/01/22 204 lb (92.5 kg)  08/17/22 204 lb 6.4 oz (92.7 kg)  07/07/22 218 lb (98.9 kg)     GEN:  Well nourished, well developed in no acute distress HEENT: Normal NECK: No JVD; No carotid bruits LYMPHATICS: No lymphadenopathy CARDIAC: RRR, no murmurs, rubs, gallops RESPIRATORY:  Clear to auscultation without rales, wheezing or rhonchi  ABDOMEN: Soft, non-tender, non-distended MUSCULOSKELETAL:  No edema; No deformity  SKIN: Warm and dry NEUROLOGIC:  Alert and oriented x 3 PSYCHIATRIC:  Normal affect   ASSESSMENT:    No diagnosis found.   PLAN:    Tachycardia/palpitations:  Zio patch x8 days on 07/13/2022 showed 12 episodes of SVT, longest lasting 19 beats.  Hypertension: On enalapril 10 mg daily and amlodipine 5 mg daily.  Appears controlled.  Leg pain/numbness: Normal ABIs 07/16/2019  Hyperlipidemia: On atorvastatin 40 mg daily.  LDL 79 on 06/01/22.  Calcium score 0 on 06/24/2022.    Obesity: Planning on starting Wegovy, awaiting insurance approval  Pulmonary nodules: Calcium score 06/24/2022 notable for small pulmonary nodules measuring up to 5 mm.  Plan repeat chest CT in 1 year to follow-up  RTC in***  Medication Adjustments/Labs and Tests Ordered: Current medicines are reviewed at length with the patient today.  Concerns regarding medicines are outlined above.  No orders of the  defined types were placed in this encounter.  No orders of the defined types were placed in this encounter.   There are no Patient Instructions on file for this visit.   Signed, Donato Heinz, MD  11/29/2022 11:18 PM    [facility]

## 2022-11-30 ENCOUNTER — Ambulatory Visit: Payer: Medicare Other | Attending: Cardiology | Admitting: Cardiology

## 2022-12-05 ENCOUNTER — Encounter: Payer: Self-pay | Admitting: Cardiology

## 2023-01-11 ENCOUNTER — Telehealth: Payer: Self-pay | Admitting: Internal Medicine

## 2023-01-11 NOTE — Telephone Encounter (Signed)
Left message for patient to call back to schedule Medicare Annual Wellness Visit.  No hx of AWV eligible as of 08/26/20  Please schedule at anytime with [facility] if patient calls the office back.    30 minute appointment.  Any questions, please call me at [phone]

## 2023-01-20 ENCOUNTER — Emergency Department (HOSPITAL_BASED_OUTPATIENT_CLINIC_OR_DEPARTMENT_OTHER): Payer: Medicare Other

## 2023-01-20 ENCOUNTER — Emergency Department (HOSPITAL_BASED_OUTPATIENT_CLINIC_OR_DEPARTMENT_OTHER)
Admission: EM | Admit: 2023-01-20 | Discharge: 2023-01-20 | Disposition: A | Discharge status: Home / Self Care | Payer: Medicare Other | Attending: Emergency Medicine | Admitting: Emergency Medicine

## 2023-01-20 ENCOUNTER — Other Ambulatory Visit: Payer: Self-pay

## 2023-01-20 DIAGNOSIS — R1012 Left upper quadrant pain: Secondary | ICD-10-CM | POA: Insufficient documentation

## 2023-01-20 DIAGNOSIS — K5900 Constipation, unspecified: Secondary | ICD-10-CM | POA: Diagnosis not present

## 2023-01-20 DIAGNOSIS — R634 Abnormal weight loss: Secondary | ICD-10-CM | POA: Insufficient documentation

## 2023-01-20 DIAGNOSIS — R000 Tachycardia, unspecified: Secondary | ICD-10-CM | POA: Diagnosis not present

## 2023-01-20 DIAGNOSIS — R1010 Upper abdominal pain, unspecified: Secondary | ICD-10-CM

## 2023-01-20 LAB — CBC
HCT: 43.8 % (ref 39.0–52.0)
Hemoglobin: 14.8 g/dL (ref 13.0–17.0)
MCH: 30.8 pg (ref 26.0–34.0)
MCHC: 33.8 g/dL (ref 30.0–36.0)
MCV: 91.3 fL (ref 80.0–100.0)
Platelets: 207 10*3/uL (ref 150–400)
RBC: 4.8 MIL/uL (ref 4.22–5.81)
RDW: 12.6 % (ref 11.5–15.5)
WBC: 6.2 10*3/uL (ref 4.0–10.5)
nRBC: 0 % (ref 0.0–0.2)

## 2023-01-20 LAB — COMPREHENSIVE METABOLIC PANEL
ALT: 13 U/L (ref 0–44)
AST: 13 U/L — ABNORMAL LOW (ref 15–41)
Albumin: 4.6 g/dL (ref 3.5–5.0)
Alkaline Phosphatase: 50 U/L (ref 38–126)
Anion gap: 9 (ref 5–15)
BUN: 15 mg/dL (ref 8–23)
CO2: 28 mmol/L (ref 22–32)
Calcium: 9.2 mg/dL (ref 8.9–10.3)
Chloride: 100 mmol/L (ref 98–111)
Creatinine, Ser: 1 mg/dL (ref 0.61–1.24)
GFR, Estimated: >60 mL/min (ref >60)
Glucose, Bld: 87 mg/dL (ref 70–99)
Potassium: 4 mmol/L (ref 3.5–5.1)
Sodium: 137 mmol/L (ref 135–145)
Total Bilirubin: 0.6 mg/dL (ref 0.3–1.2)
Total Protein: 7.1 g/dL (ref 6.5–8.1)

## 2023-01-20 LAB — URINALYSIS, ROUTINE W REFLEX MICROSCOPIC
Bilirubin Urine: NEGATIVE
Glucose, UA: NEGATIVE mg/dL
Hgb urine dipstick: NEGATIVE
Leukocytes,Ua: NEGATIVE
Nitrite: NEGATIVE
Specific Gravity, Urine: 1.024 (ref 1.005–1.030)
pH: 7 (ref 5.0–8.0)

## 2023-01-20 LAB — LIPASE, BLOOD: Lipase: 30 U/L (ref 11–51)

## 2023-01-20 LAB — TSH: TSH: 1.314 u[IU]/mL (ref 0.350–4.500)

## 2023-01-20 MED ORDER — IOHEXOL 300 MG/ML  SOLN
100.0000 mL | Freq: Once | INTRAMUSCULAR | Status: AC | PRN
  Administered 2023-01-20: 100 mL via INTRAVENOUS

## 2023-01-20 MED ORDER — SODIUM CHLORIDE 0.9 % IV BOLUS
1000.0000 mL | Freq: Once | INTRAVENOUS | Status: AC
  Administered 2023-01-20: 1000 mL via INTRAVENOUS

## 2023-01-20 NOTE — ED Triage Notes (Signed)
Pt reports ULQ pain for 3 days, mildly worsening over time. Denies any SOB, falls or injury to the area.  Pt reports mild nausea and occasional dizziness. Denies vomiting or diarrhea.

## 2023-01-20 NOTE — Discharge Instructions (Signed)
Please read and follow all provided instructions.  Your diagnoses today include:  1. Upper abdominal pain     Tests performed today include: Blood cell counts and platelets: blood cell counts are normal Kidney and liver function tests Pancreas function test (called lipase): no sign of pancreatits Urine test to look for infection A blood or urine test for pregnancy (women only) Vital signs. See below for your results today.   Medications prescribed:  None  Take any prescribed medications only as directed.  Home care instructions:  Follow any educational materials contained in this packet.  Follow-up instructions: Please follow-up with your primary care provider in the next 3 days for further evaluation of your symptoms.    Return instructions:  SEEK IMMEDIATE MEDICAL ATTENTION IF: The pain does not go away or becomes severe  A temperature above 101F develops  Repeated vomiting occurs (multiple episodes)  The pain becomes localized to portions of the abdomen. The right side could possibly be appendicitis. In an adult, the left lower portion of the abdomen could be colitis or diverticulitis.  Blood is being passed in stools or vomit (bright red or black tarry stools)  You develop chest pain, difficulty breathing, dizziness or fainting, or become confused, poorly responsive, or inconsolable (young children) If you have any other emergent concerns regarding your health  Additional Information: Abdominal (belly) pain can be caused by many things. Your caregiver performed an examination and possibly ordered blood/urine tests and imaging (CT scan, x-rays, ultrasound). Many cases can be observed and treated at home after initial evaluation in the emergency department. Even though you are being discharged home, abdominal pain can be unpredictable. Therefore, you need a repeated exam if your pain does not resolve, returns, or worsens. Most patients with abdominal pain don't have to be  admitted to the hospital or have surgery, but serious problems like appendicitis and gallbladder attacks can start out as nonspecific pain. Many abdominal conditions cannot be diagnosed in one visit, so follow-up evaluations are very important.  Your vital signs today were: BP 121/77   Pulse 94   Temp 99.1 F (37.3 C) (Oral)   Resp 12   SpO2 100%  If your blood pressure (bp) was elevated above 135/85 this visit, please have this repeated by your doctor within one month. --------------

## 2023-01-20 NOTE — ED Provider Notes (Signed)
[facility] Provider Note   CSN: [id number] Arrival date & time: 01/20/23  1159     History  Chief Complaint  Patient presents with   Abdominal Pain    Steven Sanford is a 67 y.o. male.  Patient presents to the emergency department today for evaluation of abdominal pain over the past 3 days, gradually worsening.  Pain is left upper quadrant.  Patient has noted some weight loss recently as well as elevated heart rate at times.  No chest pain or shortness of breath.  No dysuria, increased frequency urgency or hematuria.  He has had some constipation.  No vomiting, diarrhea, blood in the stool.  Denies history of previous abdominal surgeries.  Denies any recent injuries or trauma to the area.       Home Medications Prior to Admission medications   Medication Sig Start Date End Date Taking? Authorizing Provider  amLODipine (NORVASC) 5 MG tablet Take 5 mg by mouth daily.    [provider]  atorvastatin (LIPITOR) 40 MG tablet Take 1 tablet (40 mg total) by mouth daily. 06/03/22   Janith Lima, MD  enalapril (VASOTEC) 10 MG tablet Take 1 tablet (10 mg total) by mouth daily. 06/03/22   Janith Lima, MD  ondansetron (ZOFRAN) 4 MG tablet Take 1 tablet (4 mg total) by mouth every 8 (eight) hours as needed for nausea or vomiting. 07/20/22   Janith Lima, MD  tadalafil (CIALIS) 20 MG tablet Take 0.5-1 tablets (10-20 mg total) by mouth every other day as needed for erectile dysfunction. Patient not taking: Reported on 09/01/2022 06/01/22   Janith Lima, MD      Allergies    Penicillins    Review of Systems   Review of Systems  Physical Exam Updated Vital Signs BP 121/77   Pulse 94   Temp 99.1 F (37.3 C) (Oral)   Resp 12   SpO2 100%   Physical Exam Vitals and nursing note reviewed.  Constitutional:      General: He is not in acute distress.    Appearance: He is well-developed.  HENT:     Head: Normocephalic and  atraumatic.  Eyes:     General:        Right eye: No discharge.        Left eye: No discharge.     Conjunctiva/sclera: Conjunctivae normal.  Cardiovascular:     Rate and Rhythm: Regular rhythm. Tachycardia present.     Heart sounds: Normal heart sounds.     Comments: HR low 100's Pulmonary:     Effort: Pulmonary effort is normal.     Breath sounds: Normal breath sounds.  Abdominal:     Palpations: Abdomen is soft.     Tenderness: There is abdominal tenderness (Mild to moderate) in the epigastric area and left upper quadrant. There is no guarding or rebound. Negative signs include Murphy's sign and McBurney's sign.  Musculoskeletal:     Cervical back: Normal range of motion and neck supple.  Skin:    General: Skin is warm and dry.  Neurological:     Mental Status: He is alert.     ED Results / Procedures / Treatments   Labs (all labs ordered are listed, but only abnormal results are displayed) Labs Reviewed  COMPREHENSIVE METABOLIC PANEL - Abnormal; Notable for the following components:      Result Value   AST 13 (*)    All other components within normal limits  URINALYSIS, ROUTINE W REFLEX MICROSCOPIC - Abnormal; Notable for the following components:   Ketones, ur TRACE (*)    Protein, ur TRACE (*)    All other components within normal limits  LIPASE, BLOOD  CBC  TSH    ED ECG REPORT   Date: 01/20/2023  Rate: 105  Rhythm: sinus tachycardia  QRS Axis: normal  Intervals: normal  ST/T Wave abnormalities: normal  Conduction Disutrbances:none  Narrative Interpretation:   Old EKG Reviewed: none available  I have personally reviewed the EKG tracing and agree with the computerized printout as noted.   Radiology CT Abdomen Pelvis W Contrast  Result Date: 01/20/2023 CLINICAL DATA:  Abdominal pain EXAM: CT ABDOMEN AND PELVIS WITH CONTRAST TECHNIQUE: Multidetector CT imaging of the abdomen and pelvis was performed using the standard protocol following bolus  administration of intravenous contrast. RADIATION DOSE REDUCTION: This exam was performed according to the departmental dose-optimization program which includes automated exposure control, adjustment of the mA and/or kV according to patient size and/or use of iterative reconstruction technique. CONTRAST:  100 mL OMNIPAQUE IOHEXOL 300 MG/ML  SOLN COMPARISON:  None Available. FINDINGS: Lower chest: No acute abnormality. Hepatobiliary: No focal liver abnormality is seen. No gallstones, gallbladder wall thickening, or biliary dilatation. Pancreas: Unremarkable. No pancreatic ductal dilatation or surrounding inflammatory changes. Spleen: Normal in size without focal abnormality. Adrenals/Urinary Tract: Adrenal glands are unremarkable. Kidneys are normal, without renal calculi, focal lesion, or hydronephrosis. Bladder is unremarkable. Stomach/Bowel: Stomach is within normal limits. Appendix is not seen and there is no evidence for appendicitis. No evidence of bowel wall thickening, distention, or inflammatory changes. Vascular/Lymphatic: No significant vascular findings are present. No enlarged abdominal or pelvic lymph nodes. Reproductive: Prostate is unremarkable. Other: No abdominal wall hernia or abnormality. No abdominopelvic ascites. Musculoskeletal: No acute or significant osseous findings. L5-S1 degenerative changes are noted. IMPRESSION: No acute abdominal or pelvic pathology identified. Electronically Signed   By: Sammie Bench M.D.   On: 01/20/2023 15:48    Procedures Procedures    Medications Ordered in ED Medications  sodium chloride 0.9 % bolus 1,000 mL (1,000 mLs Intravenous New Bag/Given 01/20/23 1526)  iohexol (OMNIPAQUE) 300 MG/ML solution 100 mL (100 mLs Intravenous Contrast Given 01/20/23 1539)    ED Course/ Medical Decision Making/ A&P    Patient seen and examined. History obtained directly from patient. Work-up including labs, imaging, EKG ordered in triage, if performed, were  reviewed.    Labs/EKG: Independently reviewed and interpreted.  This included: CBC unremarkable; CMP unremarkable; lipase normal; UA without signs of infection.  Imaging: Ordered CT abdomen pelvis  Medications/Fluids: Ordered: IV fluid bolus   Initial impression: LUQ abd pain  4:16 PM Reassessment performed. Patient appears stable.  States that he is feeling a bit better as IV fluids are being administered.  Wife now bedside.  After discussion with patient, I added TSH to be followed up with PCP due to recent weight loss.  Imaging personally visualized and interpreted including: CT abdomen pelvis, agree negative.  Reviewed pertinent lab work and imaging with patient at bedside. Questions answered.   Most current vital signs reviewed and are as follows: BP 121/77   Pulse 94   Temp 99.1 F (37.3 C) (Oral)   Resp 12   SpO2 100%   Plan: Complete IV fluids, discharged home with symptomatic treatment.  Patient due to follow-up with his PCP in the next 1 to 2 weeks.  4:48 PM fluids completed.  Patient to be discharged.  Other home  care instructions discussed: Maintain good hydration, OTC meds as needed, trial of MiraLAX once a day until bowel movements are more regular  ED return instructions discussed: The patient was urged to return to the Emergency Department immediately with worsening of current symptoms, worsening abdominal pain, persistent vomiting, blood noted in stools, fever, or any other concerns. The patient verbalized understanding.   Follow-up instructions discussed: Patient encouraged to follow-up with their PCP in 3-5 days for recheck.                             Medical Decision Making Amount and/or Complexity of Data Reviewed Labs: ordered. Radiology: ordered.  Risk Prescription drug management.   For this patient's complaint of abdominal pain, the following conditions were considered on the differential diagnosis: gastritis/PUD, enteritis/duodenitis,  appendicitis, cholelithiasis/cholecystitis, cholangitis, pancreatitis, ruptured viscus, colitis, diverticulitis, small/large bowel obstruction, proctitis, cystitis, pyelonephritis, ureteral colic, aortic dissection, aortic aneurysm. Atypical chest etiologies were also considered including ACS, PE, and pneumonia.  The patient's vital signs, pertinent lab work and imaging were reviewed and interpreted as discussed in the ED course. Hospitalization was considered for further testing, treatments, or serial exams/observation. However as patient is well-appearing, has a stable exam, and reassuring studies today, I do not feel that they warrant admission at this time. This plan was discussed with the patient who verbalizes agreement and comfort with this plan and seems reliable and able to return to the Emergency Department with worsening or changing symptoms.          Final Clinical Impression(s) / ED Diagnoses Final diagnoses:  Upper abdominal pain    Rx / DC Orders ED Discharge Orders     None         Carlisle Cater, PA-C 01/20/23 1649    Blanchie Dessert, MD 01/21/23 [phone]

## 2023-01-25 ENCOUNTER — Telehealth: Payer: Self-pay

## 2023-01-25 ENCOUNTER — Other Ambulatory Visit: Payer: Self-pay | Admitting: Internal Medicine

## 2023-01-25 DIAGNOSIS — E6609 Other obesity due to excess calories: Secondary | ICD-10-CM

## 2023-01-25 MED ORDER — WEGOVY 2.4 MG/0.75ML ~~LOC~~ SOAJ: 2.4000 mg | SUBCUTANEOUS | 0 refills | Status: DC

## 2023-01-25 NOTE — Telephone Encounter (Signed)
Patient has questions about stopping the [facility] wants to know if he tapers down and would like to speak to a nurse. He also states that [facility] sent a request over Friday for a refill on the [facility] and he hasn't heard anything- he would like a update. Advised patient I would send message to clinica staff and someone would call him back.  Please call patient.

## 2023-01-25 NOTE — Telephone Encounter (Signed)
Per verbal conversation with PCP I was informed that you do not have to taper off of Wegovy.   Pt has been informed of the above but pt stated that he would like to continue on the mediation for a little while longer.   Please advise.

## 2023-01-27 ENCOUNTER — Telehealth: Payer: Self-pay

## 2023-01-27 NOTE — Telephone Encounter (Signed)
Key: [id number]

## 2023-01-27 NOTE — Telephone Encounter (Signed)
Per CoverMyMeds:  [place] wad denied  Reason Given: We denied this request under Medicare Part D because: Your plan's Medicare Part D drug plan cannot cover drugs used for loss of appetite (anorexia), weight loss, or weight gain. Your Medicare Part D drug plan was asked to cover the requested drug. The requested drug is used for loss of appetite (anorexia), weight loss, or  to help you gain weight. Under section 1927(d)(2) of the Social Security Act, drugs used for loss of appetite (anorexia), weight loss, or weight gain are excluded from coverage under the Medicare Part D drug benefit. Since the requested indication is excluded from Medicare Part D coverage, the request for coverage under your Medicare Part D benefit is denied

## 2023-02-06 ENCOUNTER — Ambulatory Visit: Payer: Medicare Other

## 2023-02-06 NOTE — Progress Notes (Signed)
Pt stated he refused most of  the questions asked and would prefer to be taken off the call list him and his wife.

## 2023-02-07 ENCOUNTER — Ambulatory Visit: Payer: Medicare Other | Admitting: Internal Medicine

## 2023-02-07 ENCOUNTER — Encounter: Payer: Self-pay | Admitting: Internal Medicine

## 2023-02-07 VITALS — BP 138/78 | HR 90 | Temp 98.6°F | Resp 16 | Ht 72.0 in | Wt 190.0 lb

## 2023-02-07 DIAGNOSIS — N401 Enlarged prostate with lower urinary tract symptoms: Secondary | ICD-10-CM

## 2023-02-07 DIAGNOSIS — M9908 Segmental and somatic dysfunction of rib cage: Secondary | ICD-10-CM

## 2023-02-07 DIAGNOSIS — R972 Elevated prostate specific antigen [PSA]: Secondary | ICD-10-CM | POA: Diagnosis not present

## 2023-02-07 DIAGNOSIS — I10 Essential (primary) hypertension: Secondary | ICD-10-CM

## 2023-02-07 DIAGNOSIS — R3911 Hesitancy of micturition: Secondary | ICD-10-CM

## 2023-02-07 LAB — PSA: PSA: 4.31 ng/mL — ABNORMAL HIGH (ref 0.10–4.00)

## 2023-02-07 NOTE — Progress Notes (Unsigned)
Subjective:  Patient ID: Steven Sanford, male    DOB: 03-18-1956  Age: 67 y.o. MRN: [id number]  CC: Hypertension and Hyperlipidemia   HPI Steven Sanford presents for f/up -   He walks several miles a day and has good endurance.  He denies chest pain, shortness of breath, diaphoresis, or edema.  He recently underwent a CT scan of the abdomen which was unremarkable.  He has chronic discomfort in his left lower anterior rib cage.  He denies cough, hemoptysis, fever, or chills.  He has intentionally lost weight with the GLP-1 agonist.  Outpatient Medications Prior to Visit  Medication Sig Dispense Refill   amLODipine (NORVASC) 5 MG tablet Take 5 mg by mouth daily.     atorvastatin (LIPITOR) 40 MG tablet Take 1 tablet (40 mg total) by mouth daily. 90 tablet 1   enalapril (VASOTEC) 10 MG tablet Take 1 tablet (10 mg total) by mouth daily. 90 tablet 1   ondansetron (ZOFRAN) 4 MG tablet Take 1 tablet (4 mg total) by mouth every 8 (eight) hours as needed for nausea or vomiting. 20 tablet 1   Semaglutide-Weight Management (WEGOVY) 2.4 MG/0.75ML SOAJ Inject 2.4 mg into the skin once a week. 3 mL 0   tadalafil (CIALIS) 20 MG tablet Take 0.5-1 tablets (10-20 mg total) by mouth every other day as needed for erectile dysfunction. 6 tablet 3   No facility-administered medications prior to visit.    ROS Review of Systems  Constitutional: Negative.  Negative for diaphoresis and fatigue.  HENT: Negative.    Eyes: Negative.   Respiratory:  Negative for cough, chest tightness, shortness of breath and wheezing.   Cardiovascular:  Positive for chest pain. Negative for palpitations and leg swelling.  Gastrointestinal:  Negative for abdominal pain, diarrhea, nausea and vomiting.  Endocrine: Negative.   Genitourinary:  Positive for difficulty urinating. Negative for dysuria, hematuria and urgency.  Musculoskeletal:  Positive for neck pain.  Skin: Negative.   Neurological:  Negative for dizziness, weakness  and light-headedness.  Hematological:  Negative for adenopathy. Does not bruise/bleed easily.  Psychiatric/Behavioral: Negative.      Objective:  BP 138/78 (BP Location: Left Arm, Patient Position: Sitting, Cuff Size: Large)   Pulse 90   Temp 98.6 F (37 C) (Oral)   Resp 16   Ht 6' (1.829 m)   Wt 190 lb (86.2 kg)   SpO2 99%   BMI 25.77 kg/m   BP Readings from Last 3 Encounters:  02/07/23 138/78  01/20/23 121/77  09/01/22 (!) 115/58    Wt Readings from Last 3 Encounters:  02/07/23 190 lb (86.2 kg)  09/01/22 204 lb (92.5 kg)  08/17/22 204 lb 6.4 oz (92.7 kg)    Physical Exam Vitals reviewed.  HENT:     Mouth/Throat:     Mouth: Mucous membranes are moist.  Eyes:     General: No scleral icterus.    Conjunctiva/sclera: Conjunctivae normal.  Cardiovascular:     Rate and Rhythm: Normal rate and regular rhythm.     Heart sounds: No murmur heard. Pulmonary:     Effort: Pulmonary effort is normal.     Breath sounds: No stridor. No wheezing, rhonchi or rales.  Abdominal:     General: Abdomen is flat.     Palpations: There is no mass.     Tenderness: There is no abdominal tenderness. There is no guarding or rebound.     Hernia: No hernia is present.  Musculoskeletal:  General: Normal range of motion.     Cervical back: Neck supple.     Right lower leg: No edema.     Left lower leg: No edema.  Lymphadenopathy:     Cervical: No cervical adenopathy.  Skin:    General: Skin is warm and dry.  Neurological:     General: No focal deficit present.     Mental Status: He is alert. Mental status is at baseline.  Psychiatric:        Mood and Affect: Mood normal.        Behavior: Behavior normal.     Lab Results  Component Value Date   WBC 6.2 01/20/2023   HGB 14.8 01/20/2023   HCT 43.8 01/20/2023   PLT 207 01/20/2023   GLUCOSE 87 01/20/2023   CHOL 159 06/01/2022   TRIG 79.0 06/01/2022   HDL 63.70 06/01/2022   LDLCALC 79 06/01/2022   ALT 13 01/20/2023   AST  13 (L) 01/20/2023   NA 137 01/20/2023   K 4.0 01/20/2023   CL 100 01/20/2023   CREATININE 1.00 01/20/2023   BUN 15 01/20/2023   CO2 28 01/20/2023   TSH 1.314 01/20/2023   PSA 4.31 (H) 02/07/2023   HGBA1C 5.7 06/01/2022    CT Abdomen Pelvis W Contrast  Result Date: 01/20/2023 CLINICAL DATA:  Abdominal pain EXAM: CT ABDOMEN AND PELVIS WITH CONTRAST TECHNIQUE: Multidetector CT imaging of the abdomen and pelvis was performed using the standard protocol following bolus administration of intravenous contrast. RADIATION DOSE REDUCTION: This exam was performed according to the departmental dose-optimization program which includes automated exposure control, adjustment of the mA and/or kV according to patient size and/or use of iterative reconstruction technique. CONTRAST:  100 mL OMNIPAQUE IOHEXOL 300 MG/ML  SOLN COMPARISON:  None Available. FINDINGS: Lower chest: No acute abnormality. Hepatobiliary: No focal liver abnormality is seen. No gallstones, gallbladder wall thickening, or biliary dilatation. Pancreas: Unremarkable. No pancreatic ductal dilatation or surrounding inflammatory changes. Spleen: Normal in size without focal abnormality. Adrenals/Urinary Tract: Adrenal glands are unremarkable. Kidneys are normal, without renal calculi, focal lesion, or hydronephrosis. Bladder is unremarkable. Stomach/Bowel: Stomach is within normal limits. Appendix is not seen and there is no evidence for appendicitis. No evidence of bowel wall thickening, distention, or inflammatory changes. Vascular/Lymphatic: No significant vascular findings are present. No enlarged abdominal or pelvic lymph nodes. Reproductive: Prostate is unremarkable. Other: No abdominal wall hernia or abnormality. No abdominopelvic ascites. Musculoskeletal: No acute or significant osseous findings. L5-S1 degenerative changes are noted. IMPRESSION: No acute abdominal or pelvic pathology identified. Electronically Signed   By: Steven Sanford M.D.    On: 01/20/2023 15:48    Assessment & Plan:   Steven Sanford was seen today for hypertension and hyperlipidemia.  Diagnoses and all orders for this visit:  PSA elevation -     PSA; Future -     PSA  Rising PSA level -     Ambulatory referral to Urology  Rib cage region somatic dysfunction- He would like to start doing some dry needling. -     Ambulatory referral to Physical Therapy  Primary hypertension- His blood pressure is well-controlled.  Will continue the current antihypertensives.   I am having Jerl Mina "CJ" maintain his tadalafil, enalapril, atorvastatin, amLODipine, ondansetron, and Wegovy.  No orders of the defined types were placed in this encounter.    Follow-up: Return in about 4 months (around 06/08/2023).  Scarlette Calico, MD

## 2023-02-09 DIAGNOSIS — M9908 Segmental and somatic dysfunction of rib cage: Secondary | ICD-10-CM | POA: Insufficient documentation

## 2023-02-09 MED ORDER — TADALAFIL 5 MG PO TABS: 5.0000 mg | ORAL_TABLET | Freq: Every day | ORAL | 1 refills | Status: DC

## 2023-02-20 ENCOUNTER — Encounter: Payer: Medicare Other | Admitting: Urology

## 2023-02-27 ENCOUNTER — Other Ambulatory Visit: Payer: Self-pay

## 2023-02-27 ENCOUNTER — Ambulatory Visit: Payer: Medicare Other | Attending: Internal Medicine

## 2023-02-27 DIAGNOSIS — M9908 Segmental and somatic dysfunction of rib cage: Secondary | ICD-10-CM

## 2023-02-27 DIAGNOSIS — R29898 Other symptoms and signs involving the musculoskeletal system: Secondary | ICD-10-CM | POA: Insufficient documentation

## 2023-02-27 DIAGNOSIS — M6281 Muscle weakness (generalized): Secondary | ICD-10-CM | POA: Diagnosis present

## 2023-02-27 DIAGNOSIS — R293 Abnormal posture: Secondary | ICD-10-CM

## 2023-02-27 NOTE — Therapy (Signed)
OUTPATIENT PHYSICAL THERAPY THORACOLUMBAR EVALUATION   Patient Name: Steven Sanford MRN: [id number] DOB:12-Feb-1956,67 y.o., male 60 Date: 02/27/2023   END OF SESSION:  PT End of Session - 02/27/23 1101     Visit Number 1    Number of Visits 13    Date for PT Re-Evaluation 04/15/23    Authorization Type MCR    PT Start Time 1101    PT Stop Time 1143    PT Time Calculation (min) 42 min    Activity Tolerance Patient tolerated treatment well    Behavior During Therapy [facility] for tasks assessed/performed              Past Medical History:  Diagnosis Date   GERD (gastroesophageal reflux disease)    Hypertension    Past Surgical History:  Procedure Laterality Date   EYE SURGERY Left 2022   5 operations in total   Patient Active Problem List   Diagnosis Date Noted   Rib cage region somatic dysfunction 02/09/2023   PSA elevation 02/07/2023   Rising PSA level 02/07/2023   Encounter for general adult medical examination with abnormal findings 06/01/2022   Hyperlipidemia LDL goal <100 06/01/2022   Gastroesophageal reflux disease without esophagitis 06/01/2022   Primary hypertension 06/01/2022   Benign prostatic hyperplasia with urinary hesitancy 06/01/2022   Class 1 obesity due to excess calories with serious comorbidity and body mass index (BMI) of 31.0 to 31.9 in adult 06/01/2022   Peripheral sensory neuropathy 06/01/2022   Polyp of colon 06/01/2022   Erectile dysfunction due to arterial insufficiency 06/01/2022   Pure hypercholesterolemia, unspecified 04/17/2015    PCP: Janith Lima, MD   REFERRING PROVIDER: Janith Lima, MD   REFERRING DIAG: M99.08 (ICD-10-CM) - Rib cage region somatic dysfunction   Rationale for Evaluation and Treatment: Rehabilitation  THERAPY DIAG:  Rib cage region somatic dysfunction  Other symptoms and signs involving the musculoskeletal system  Abnormal posture  Muscle weakness (generalized)  ONSET DATE: chronic    SUBJECTIVE:                                                                                                                                                                                           SUBJECTIVE STATEMENT: Patient reports having initial pain under his Lt ribcage thinking it could have been his spleen. He went to the ED for this about 3-4 weeks ago and was told the large intestine might have been impacted. He took a laxative and this helped with the initial pain under his ribs, but since this resolved he has now recently been experiencing pain along the Lt anterolateral lower ribcage.  He has tried dry needling previously for other neck and back issues that has been helpful for pain and is wondering if this could help his current condition. He reports the pain has improved since initial onset and feels like a bruise, "like something hit it". No red flag symptoms. He denies any numbness/tingling or rash about the area.   PERTINENT HISTORY:  Hypertension GERD   PAIN:  Are you having pain? Yes: NPRS scale: 3 (at worst 6) /10 Pain location: Lt anterolateral ribcage Pain description: sore/bruise Aggravating factors: hot shower; laying on Lt side Relieving factors: cold  PRECAUTIONS: None  WEIGHT BEARING RESTRICTIONS: No  FALLS:  Has patient fallen in last 6 months? Yes. Number of falls 1 tripped in the park  walking dog on uneven pavement.   LIVING ENVIRONMENT: Lives with: lives with their family Lives in: House/apartment Stairs: Yes: Internal: multiple flights steps; on left going up Has following equipment at home: None  OCCUPATION: retired   PLOF: Independent  PATIENT GOALS: "I want to get where I can move around better and get out of some of the pain I'm having."    OBJECTIVE:   DIAGNOSTIC FINDINGS:  Abdomen and pelvis CT: IMPRESSION: No acute abdominal or pelvic pathology identified.  PATIENT SURVEYS:  FOTO not setup   SCREENING FOR RED FLAGS: Bowel or  bladder incontinence: No Spinal tumors: No Cauda equina syndrome: No Compression fracture: No Abdominal aneurysm: No  COGNITION: Overall cognitive status: Within functional limits for tasks assessed     SENSATION: Not tested  MUSCLE LENGTH: Hamstrings: Right lacking 55 deg; Left lacking 55 deg   POSTURE: decreased lumbar lordosis and decreased thoracic kyphosis  PALPATION: Hypomobility throughout T-spine and L-spine Tautness and palpable tenderness bilateral thoracic paraspinals  No palpable tenderness with rib or sternal palpation   LUMBAR ROM:   AROM eval  Flexion 75% limited  Extension 50% limited  Right lateral flexion 25% limited  Left lateral flexion 25% limited  Right rotation 25% limited   Left rotation 25% limited pain   (Blank rows = not tested)  LOWER EXTREMITY ROM:     Active  Right eval Left eval  Hip flexion    Hip extension    Hip abduction    Hip adduction    Hip internal rotation    Hip external rotation    Knee flexion    Knee extension    Ankle dorsiflexion    Ankle plantarflexion    Ankle inversion    Ankle eversion     (Blank rows = not tested)  MMT:    MMT Right eval Left eval  Middle Trap  4 4  Hip extension 4 4  Hip abduction    Hip adduction    Hip internal rotation    Hip external rotation    Knee flexion    Knee extension    Ankle dorsiflexion    Ankle plantarflexion    Ankle inversion    Ankle eversion     (Blank rows = not tested)  LUMBAR SPECIAL TESTS:  N/A  FUNCTIONAL TESTS:  N/A   GAIT: Distance walked: 10 ft  Assistive device utilized: None Level of assistance: Complete Independence Comments: WNL  [facility] Adult PT Treatment:  DATE: 02/27/23  Therapeutic Exercise: Demonstrated and issued initial HEP.    Therapeutic Activity: Education on assessment findings that will be addressed throughout duration of POC.       PATIENT EDUCATION:  Education  details: see treatment; TPDN  Person educated: Patient Education method: Explanation, Demonstration, Tactile cues, Verbal cues, and Handouts Education comprehension: verbalized understanding, returned demonstration, verbal cues required, tactile cues required, and needs further education  HOME EXERCISE PROGRAM: Access Code: [email or web address] URL: [email or web address] Date: 02/27/2023 Prepared by: Gwendolyn Grant  Exercises - Supine Lower Trunk Rotation  - 1 x daily - 7 x weekly - 1 sets - 10 reps - Sidelying Thoracic Rotation with Open Book  - 1 x daily - 7 x weekly - 1 sets - 10 reps - Supine Diaphragmatic Breathing  - 1 x daily - 7 x weekly - 1 sets - 10 reps - Seated Hamstring Stretch  - 1 x daily - 7 x weekly - 3 sets - 30 sec  hold - Doorway Pec Stretch at 90 Degrees Abduction  - 1 x daily - 7 x weekly - 3 sets - 30 sec  hold  ASSESSMENT:  CLINICAL IMPRESSION: Patient is a 67 y.o. male who was seen today for physical therapy evaluation and treatment for acute Lt anterolateral lower ribcage pain of insidious onset. Upon assessment he is noted to have significant trunk ROM deficits with pain reported about the ribcage with Lt rotation AROM, thoracic and lumbar hypomobility, tautness about thoracic musculature, significant hamstring tightness, posterior chain weakness, and postural abnormalities. He will benefit from skilled PT to address the above stated deficits in order to optimize his function and assist in overall pain reduction.   OBJECTIVE IMPAIRMENTS: decreased knowledge of condition, decreased ROM, decreased strength, hypomobility, increased fascial restrictions, impaired flexibility, improper body mechanics, postural dysfunction, and pain.   ACTIVITY LIMITATIONS: carrying, lifting, bending, and squatting  PARTICIPATION LIMITATIONS: meal prep, cleaning, laundry, shopping, community activity, and yard work  PERSONAL FACTORS: Age and Fitness are also affecting patient's  functional outcome.   REHAB POTENTIAL: Good  CLINICAL DECISION MAKING: Stable/uncomplicated  EVALUATION COMPLEXITY: Low   GOALS: Goals reviewed with patient? Yes  SHORT TERM GOALS: Target date: 03/20/2023    Patient will be independent and compliant with initial HEP.   Baseline: see above Goal status: INITIAL  2.  Therapist will capture FOTO and set appropriate LTG.  Baseline: not set up at evaluation.  Goal status: INITIAL  3.  Patient will report pain free trunk AROM to improve his ability to complete reaching and bending activity.  Baseline: see above  Goal status: INITIAL  4.  Patient will improve hamstring flexibility by at least 10 degrees bilaterally to reduce stress on the back with bending activity.  Baseline: see above  Goal status: INITIAL  LONG TERM GOALS: Target date: 04/10/2023    Patient will report pain at worst rated as </= 3/10 to reduce current functional limitations.  Baseline: see above Goal status: INITIAL  2.  Patient will demonstrate 5/5 middle trap strength to improve postural stability.  Baseline: see above Goal status: INITIAL  3.  Patient will improve trunk flexion AROM by at least 25% to improve bending mechanics.  Baseline: see above Goal status: INITIAL  4.  Patient will be independent with advanced home program to progress/maintain current level of function.  Baseline: see above Goal status: INITIAL  5.  FOTO goal TBA.  Baseline: TBA Goal status: TBA   PLAN:  PT FREQUENCY: 1-2x/week  PT DURATION: 6 weeks  PLANNED INTERVENTIONS: Therapeutic exercises, Therapeutic activity, Neuromuscular re-education, Balance training, Patient/Family education, Self Care, Joint mobilization, Dry Needling, Electrical stimulation, Spinal manipulation, Spinal mobilization, Cryotherapy, Moist heat, Taping, Manual therapy, and Re-evaluation.  PLAN FOR NEXT SESSION: CAPTURE FOTO (not setup); review and progress HEP prn; trunk mobility all planes;  TPDN to thoracic paraspinals   Gwendolyn Grant, PT, DPT, ATC 12/27/22 1:20 PM

## 2023-03-01 ENCOUNTER — Ambulatory Visit: Payer: Medicare Other

## 2023-03-01 DIAGNOSIS — M9908 Segmental and somatic dysfunction of rib cage: Secondary | ICD-10-CM

## 2023-03-01 DIAGNOSIS — R29898 Other symptoms and signs involving the musculoskeletal system: Secondary | ICD-10-CM

## 2023-03-01 DIAGNOSIS — M6281 Muscle weakness (generalized): Secondary | ICD-10-CM

## 2023-03-01 DIAGNOSIS — R293 Abnormal posture: Secondary | ICD-10-CM

## 2023-03-01 NOTE — Patient Instructions (Signed)

## 2023-03-01 NOTE — Therapy (Signed)
OUTPATIENT PHYSICAL THERAPY TREATMENT NOTE   Patient Name: Steven Sanford MRN: [id number] DOB:1956-09-30, 67 y.o., male Today's Date: 03/02/2023  PCP: Janith Lima, MD   REFERRING PROVIDER: Janith Lima, MD    END OF SESSION:   PT End of Session - 03/01/23 1700     Visit Number 2    Number of Visits 13    Date for PT Re-Evaluation 04/15/23    Authorization Type MCR    PT Start Time 1700    PT Stop Time 1745    PT Time Calculation (min) 45 min    Activity Tolerance Patient tolerated treatment well    Behavior During Therapy [facility] for tasks assessed/performed             Past Medical History:  Diagnosis Date   GERD (gastroesophageal reflux disease)    Hypertension    Past Surgical History:  Procedure Laterality Date   EYE SURGERY Left 2022   5 operations in total   Patient Active Problem List   Diagnosis Date Noted   Rib cage region somatic dysfunction 02/09/2023   PSA elevation 02/07/2023   Rising PSA level 02/07/2023   Encounter for general adult medical examination with abnormal findings 06/01/2022   Hyperlipidemia LDL goal <100 06/01/2022   Gastroesophageal reflux disease without esophagitis 06/01/2022   Primary hypertension 06/01/2022   Benign prostatic hyperplasia with urinary hesitancy 06/01/2022   Class 1 obesity due to excess calories with serious comorbidity and body mass index (BMI) of 31.0 to 31.9 in adult 06/01/2022   Peripheral sensory neuropathy 06/01/2022   Polyp of colon 06/01/2022   Erectile dysfunction due to arterial insufficiency 06/01/2022   Pure hypercholesterolemia, unspecified 04/17/2015    REFERRING DIAG: : M99.08 (ICD-10-CM) - Rib cage region somatic dysfunction    THERAPY DIAG:  Rib cage region somatic dysfunction  Other symptoms and signs involving the musculoskeletal system  Abnormal posture  Muscle weakness (generalized)  Rationale for Evaluation and Treatment Rehabilitation  PERTINENT HISTORY: hypertension,  GERD  PRECAUTIONS: none   SUBJECTIVE:                                                                                                                                                                                      SUBJECTIVE STATEMENT:  Patient reports he misplaced his exercises, so has been trying do complete what he remembers. He reports more pain last night, but is better today.    PAIN:  Are you having pain? Yes: NPRS scale: 3/10 Pain location: Lt anterolateral ribcage Pain description: sore/bruise Aggravating factors: hot shower; laying on Lt side Relieving factors: cold   OBJECTIVE: (objective measures completed at initial  evaluation unless otherwise dated) DIAGNOSTIC FINDINGS:  Abdomen and pelvis CT: IMPRESSION: No acute abdominal or pelvic pathology identified.   PATIENT SURVEYS:  FOTO not setup initial eval  03/01/23: FOTO: 51% function to 66% predicted    SCREENING FOR RED FLAGS: Bowel or bladder incontinence: No Spinal tumors: No Cauda equina syndrome: No Compression fracture: No Abdominal aneurysm: No   COGNITION: Overall cognitive status: Within functional limits for tasks assessed                          SENSATION: Not tested   MUSCLE LENGTH: Hamstrings: Right lacking 55 deg; Left lacking 55 deg     POSTURE: decreased lumbar lordosis and decreased thoracic kyphosis   PALPATION: Hypomobility throughout T-spine and L-spine Tautness and palpable tenderness bilateral thoracic paraspinals  No palpable tenderness with rib or sternal palpation    LUMBAR ROM:    AROM eval  Flexion 75% limited  Extension 50% limited  Right lateral flexion 25% limited  Left lateral flexion 25% limited  Right rotation 25% limited   Left rotation 25% limited pain   (Blank rows = not tested)   LOWER EXTREMITY ROM:      Active  Right eval Left eval  Hip flexion      Hip extension      Hip abduction      Hip adduction      Hip internal rotation      Hip  external rotation      Knee flexion      Knee extension      Ankle dorsiflexion      Ankle plantarflexion      Ankle inversion      Ankle eversion       (Blank rows = not tested)   MMT:     MMT Right eval Left eval  Middle Trap  4 4  Hip extension 4 4  Hip abduction      Hip adduction      Hip internal rotation      Hip external rotation      Knee flexion      Knee extension      Ankle dorsiflexion      Ankle plantarflexion      Ankle inversion      Ankle eversion       (Blank rows = not tested)   LUMBAR SPECIAL TESTS:  N/A   FUNCTIONAL TESTS:  N/A    GAIT: Distance walked: 10 ft  Assistive device utilized: None Level of assistance: Complete Independence Comments: WNL  [facility] Adult PT Treatment:                                                DATE: 03/01/23 Therapeutic Exercise: LTR x 60 seconds Diaphragmatic breathing x 5 Seated HS stretch x 30 sec each  Pec doorway stretch 2x 30 sec  Sidelying thoracic rotation  x 5 Manual Therapy: STM bilateral thoracic paraspinals, Lt rhomboids, traps CPAs Thoracic spine grade II-III STM Lt pectoralis major/minor Skilled palpation of trigger points and monitoring throughout TPDN.  Trigger Point Dry Needling Treatment: Pre-treatment instruction: Patient instructed on dry needling rationale, procedures, and possible side effects including pain during treatment (achy,cramping feeling), bruising, drop of blood, lightheadedness, nausea, sweating. Patient Consent Given: Yes Education handout provided: Yes Muscles treated: Lt  thoracic parapinals, Lt rhomoids, Lt pectoralis major/minor   Needle size and number: .30x58m x 1 and .25x322mx 2 Electrical stimulation performed: No Parameters: N/A Treatment response/outcome: Twitch response elicited and Palpable decrease in muscle tension Post-treatment instructions: Patient instructed to expect possible mild to moderate muscle soreness later today and/or tomorrow. Patient instructed in  methods to reduce muscle soreness and to continue prescribed HEP. If patient was dry needled over the lung field, patient was instructed on signs and symptoms of pneumothorax and, however unlikely, to see immediate medical attention should they occur. Patient was also educated on signs and symptoms of infection and to seek medical attention should they occur. Patient verbalized understanding of these instructions and education.   [facility]                                                [facility]: 02/27/23   Therapeutic Exercise: Demonstrated and issued initial HEP.      Therapeutic Activity: Education on assessment findings that will be addressed throughout duration of POC.            PATIENT EDUCATION:  Education details: HEP;TPDN;FOTO  Person educated: Patient Education method: ExConsulting civil engineerDeMedia plannerTaCorporate treasurerues, Verbal cues, and Handouts Education comprehension: verbalized understanding, returned demonstration, verbal cues required, tactile cues required, and needs further education   HOME EXERCISE PROGRAM: Access Code: [email or web address]: [email or web address] Date: 02/27/2023 Prepared by: SaGwendolyn Grant Exercises - Supine Lower Trunk Rotation  - 1 x daily - 7 x weekly - 1 sets - 10 reps - Sidelying Thoracic Rotation with Open Book  - 1 x daily - 7 x weekly - 1 sets - 10 reps - Supine Diaphragmatic Breathing  - 1 x daily - 7 x weekly - 1 sets - 10 reps - Seated Hamstring Stretch  - 1 x daily - 7 x weekly - 3 sets - 30 sec  hold - Doorway Pec Stretch at 90 Degrees Abduction  - 1 x daily - 7 x weekly - 3 sets - 30 sec  hold   ASSESSMENT:   CLINICAL IMPRESSION: Patient arrives with mild Lt anterior lower ribcage pain. TPDN was performed to Lt rhomboids, thoracic paraspinals, and pectoral musculature with twitch response elicited with TPDN of the rhomboids. Time spent reviewing HEP as patient misplaced his HEP that was issued at evaluation. Cues required  for set up of majority of exercises. FOTO score was captured with LTG set based upon predicted outcome. No changes made to HEP at this time, but was reprinted and issued to patient at conclusion of session.   OBJECTIVE IMPAIRMENTS: decreased knowledge of condition, decreased ROM, decreased strength, hypomobility, increased fascial restrictions, impaired flexibility, improper body mechanics, postural dysfunction, and pain.    ACTIVITY LIMITATIONS: carrying, lifting, bending, and squatting   PARTICIPATION LIMITATIONS: meal prep, cleaning, laundry, shopping, community activity, and yard work   PERSONAL FACTORS: Age and Fitness are also affecting patient's functional outcome.    REHAB POTENTIAL: Good   CLINICAL DECISION MAKING: Stable/uncomplicated   EVALUATION COMPLEXITY: Low     GOALS: Goals reviewed with patient? Yes   SHORT TERM GOALS: Target date: 03/20/2023       Patient will be independent and compliant with initial HEP.    Baseline: see above Goal status: INITIAL   2.  Therapist will capture FOTO and set appropriate  LTG.  Baseline: not set up at evaluation.  Goal status: met   3.  Patient will report pain free trunk AROM to improve his ability to complete reaching and bending activity.  Baseline: see above  Goal status: INITIAL   4.  Patient will improve hamstring flexibility by at least 10 degrees bilaterally to reduce stress on the back with bending activity.  Baseline: see above  Goal status: INITIAL   LONG TERM GOALS: Target date: 04/10/2023       Patient will report pain at worst rated as </= 3/10 to reduce current functional limitations.  Baseline: see above Goal status: INITIAL   2.  Patient will demonstrate 5/5 middle trap strength to improve postural stability.  Baseline: see above Goal status: INITIAL   3.  Patient will improve trunk flexion AROM by at least 25% to improve bending mechanics.  Baseline: see above Goal status: INITIAL   4.  Patient  will be independent with advanced home program to progress/maintain current level of function.  Baseline: see above Goal status: INITIAL   5.  Patient will score at least 66% on FOTO to signify clinically meaningful improvement in functional abilities.   Baseline: see above Goal status: new      PLAN:   PT FREQUENCY: 1-2x/week   PT DURATION: 6 weeks   PLANNED INTERVENTIONS: Therapeutic exercises, Therapeutic activity, Neuromuscular re-education, Balance training, Patient/Family education, Self Care, Joint mobilization, Dry Needling, Electrical stimulation, Spinal manipulation, Spinal mobilization, Cryotherapy, Moist heat, Taping, Manual therapy, and Re-evaluation.   PLAN FOR NEXT SESSION:  review and progress HEP prn; trunk mobility all planes; TPDN response, hamstring stretching, postural strengthening    Gwendolyn Grant, PT, DPT, ATC 03/02/23 8:27 AM

## 2023-03-13 NOTE — Therapy (Signed)
OUTPATIENT PHYSICAL THERAPY TREATMENT NOTE   Patient Name: Steven Sanford MRN: [id number] DOB:1956-07-03, 67 y.o., male Today's Date: 03/14/2023  PCP: Janith Lima, MD   REFERRING PROVIDER: Janith Lima, MD     END OF SESSION:   PT End of Session - 03/14/23 0855     Visit Number 3    Number of Visits 13    Date for PT Re-Evaluation 04/15/23    Authorization Type MCR    Progress Note Due on Visit 10    PT Start Time 0850    PT Stop Time 0932    PT Time Calculation (min) 42 min    Activity Tolerance Patient tolerated treatment well    Behavior During Therapy [facility] for tasks assessed/performed              Past Medical History:  Diagnosis Date   GERD (gastroesophageal reflux disease)    Hypertension    Past Surgical History:  Procedure Laterality Date   EYE SURGERY Left 2022   5 operations in total   Patient Active Problem List   Diagnosis Date Noted   Rib cage region somatic dysfunction 02/09/2023   PSA elevation 02/07/2023   Rising PSA level 02/07/2023   Encounter for general adult medical examination with abnormal findings 06/01/2022   Hyperlipidemia LDL goal <100 06/01/2022   Gastroesophageal reflux disease without esophagitis 06/01/2022   Primary hypertension 06/01/2022   Benign prostatic hyperplasia with urinary hesitancy 06/01/2022   Class 1 obesity due to excess calories with serious comorbidity and body mass index (BMI) of 31.0 to 31.9 in adult 06/01/2022   Peripheral sensory neuropathy 06/01/2022   Polyp of colon 06/01/2022   Erectile dysfunction due to arterial insufficiency 06/01/2022   Pure hypercholesterolemia, unspecified 04/17/2015    REFERRING DIAG: : M99.08 (ICD-10-CM) - Rib cage region somatic dysfunction    THERAPY DIAG:  Rib cage region somatic dysfunction  Other symptoms and signs involving the musculoskeletal system  Abnormal posture  Muscle weakness (generalized)  Rationale for Evaluation and Treatment  Rehabilitation  PERTINENT HISTORY: hypertension, GERD  PRECAUTIONS: none   SUBJECTIVE:                                                                                                                                                                                      SUBJECTIVE STATEMENT:  Patient reports majority of his pain is located to the left arm and hand tingling. The rib pain has basically gone.    PAIN:  Are you having pain? Yes:  NPRS scale: 1/10 Pain location: Left anterolateral ribcage Pain description: sore / bruise Aggravating factors: Hot shower; laying on Left side Relieving  factors: Cold   OBJECTIVE: (objective measures completed at initial evaluation unless otherwise dated) PATIENT SURVEYS:  03/01/23: FOTO: 51% function to 66% predicted    MUSCLE LENGTH: Hamstrings: Right lacking 55 deg; Left lacking 55 deg    POSTURE:  Decreased lumbar lordosis and decreased thoracic kyphosis   PALPATION: Hypomobility throughout T-spine and L-spine Tautness and palpable tenderness bilateral thoracic paraspinals  No palpable tenderness with rib or sternal palpation    LUMBAR ROM:    AROM eval  Flexion 75% limited  Extension 50% limited  Right lateral flexion 25% limited  Left lateral flexion 25% limited  Right rotation 25% limited   Left rotation 25% limited pain   (Blank rows = not tested)   LOWER EXTREMITY ROM:      Active  Right eval Left eval  Hip flexion      Hip extension      Hip abduction      Hip adduction      Hip internal rotation      Hip external rotation      Knee flexion      Knee extension      Ankle dorsiflexion      Ankle plantarflexion      Ankle inversion      Ankle eversion       (Blank rows = not tested)   MMT:     MMT Right eval Left eval  Middle Trap  4 4  Hip extension 4 4  Hip abduction      Hip adduction      Hip internal rotation      Hip external rotation      Knee flexion      Knee extension      Ankle  dorsiflexion      Ankle plantarflexion      Ankle inversion      Ankle eversion       (Blank rows = not tested)   GAIT: Distance walked: 10 ft  Assistive device utilized: None Level of assistance: Complete Independence Comments: WNL   TODAY'S TREATMENT [facility] Adult PT Treatment:                                                DATE: 03/14/2023 Therapeutic Exercise: UBE L1 x 4 min (fwd/bwd) while taking subjective Sidelying thoracic rotation x 5 each Seated thoracic extension x 5 Supine thoracic extension over FR x 5 Step back thoracic extension stretch at counter x 5 Seated upper trap and levator scap stretch 2 x 15 sec each Row with FM 13# x 10 Manual Therapy: Skilled palpation and monitoring of muscle tension while performing TPDN STM left neck and shoulder region Trigger Point Dry Needling Treatment: Pre-treatment instruction: Patient instructed on dry needling rationale, procedures, and possible side effects including pain during treatment (achy,cramping feeling), bruising, drop of blood, lightheadedness, nausea, sweating. Patient Consent Given: Yes Education handout provided: Yes Muscles treated: Left upper trap, bilateral rhomboids/mid trap region, left infraspinatus Needle size and number: .30x37mm x 6 Electrical stimulation performed: No Parameters: N/A Treatment response/outcome: Twitch response elicited and Palpable decrease in muscle tension Post-treatment instructions: Patient instructed to expect possible mild to moderate muscle soreness later today and/or tomorrow. Patient instructed in methods to reduce muscle soreness and to continue prescribed HEP. If patient was dry needled over the lung field, patient was instructed  on signs and symptoms of pneumothorax and, however unlikely, to see immediate medical attention should they occur. Patient was also educated on signs and symptoms of infection and to seek medical attention should they occur. Patient verbalized  understanding of these instructions and education.   [facility] Adult PT Treatment:                                                DATE: 03/01/23 Therapeutic Exercise: LTR x 60 seconds Diaphragmatic breathing x 5 Seated HS stretch x 30 sec each  Pec doorway stretch 2x 30 sec  Sidelying thoracic rotation  x 5 Manual Therapy: STM bilateral thoracic paraspinals, Lt rhomboids, traps CPAs Thoracic spine grade II-III STM Lt pectoralis major/minor Skilled palpation of trigger points and monitoring throughout TPDN.  Trigger Point Dry Needling Treatment: Pre-treatment instruction: Patient instructed on dry needling rationale, procedures, and possible side effects including pain during treatment (achy,cramping feeling), bruising, drop of blood, lightheadedness, nausea, sweating. Patient Consent Given: Yes Education handout provided: Yes Muscles treated: Lt thoracic parapinals, Lt rhomoids, Lt pectoralis major/minor   Needle size and number: .30x25mm x 1 and .25x73mm x 2 Electrical stimulation performed: No Parameters: N/A Treatment response/outcome: Twitch response elicited and Palpable decrease in muscle tension Post-treatment instructions: Patient instructed to expect possible mild to moderate muscle soreness later today and/or tomorrow. Patient instructed in methods to reduce muscle soreness and to continue prescribed HEP. If patient was dry needled over the lung field, patient was instructed on signs and symptoms of pneumothorax and, however unlikely, to see immediate medical attention should they occur. Patient was also educated on signs and symptoms of infection and to seek medical attention should they occur. Patient verbalized understanding of these instructions and education.     PATIENT EDUCATION:  Education details: HEP, TPDN Person educated: Patient Education method: Explanation, Demonstration, Tactile cues, Verbal cues Education comprehension: verbalized understanding, returned  demonstration, verbal cues required, tactile cues required, and needs further education   HOME EXERCISE PROGRAM: Access Code: [email or web address]   ASSESSMENT: CLINICAL IMPRESSION: Patient tolerated therapy well with no adverse effects. Continued with TPDN for the left neck, shoulder, and thoracic region with good tolerance and patient reporting improvement in symptoms following therapy. Therapy also focused on progressing thoracic mobility and postural control as he does exhibit kyphotic thoracic positioning with hypomobility and rounded shoulder posture. No changes made to HEP this visit. Patient would benefit from continued skilled PT to progress his mobility and strength in order to reduce pain and maximize functional mobility.   OBJECTIVE IMPAIRMENTS: decreased knowledge of condition, decreased ROM, decreased strength, hypomobility, increased fascial restrictions, impaired flexibility, improper body mechanics, postural dysfunction, and pain.    ACTIVITY LIMITATIONS: carrying, lifting, bending, and squatting   PARTICIPATION LIMITATIONS: meal prep, cleaning, laundry, shopping, community activity, and yard work   PERSONAL FACTORS: Age and Fitness are also affecting patient's functional outcome.     GOALS: Goals reviewed with patient? Yes   SHORT TERM GOALS: Target date: 03/20/2023   Patient will be independent and compliant with initial HEP.   Baseline: see above Goal status: INITIAL   2.  Therapist will capture FOTO and set appropriate LTG.  Baseline: not set up at evaluation.  Goal status: met   3.  Patient will report pain free trunk AROM to improve his ability to complete reaching and bending activity.  Baseline:  see above  Goal status: INITIAL   4.  Patient will improve hamstring flexibility by at least 10 degrees bilaterally to reduce stress on the back with bending activity.  Baseline: see above  Goal status: INITIAL   LONG TERM GOALS: Target date: 04/10/2023   Patient will  report pain at worst rated as </= 3/10 to reduce current functional limitations.  Baseline: see above Goal status: INITIAL   2.  Patient will demonstrate 5/5 middle trap strength to improve postural stability.  Baseline: see above Goal status: INITIAL   3.  Patient will improve trunk flexion AROM by at least 25% to improve bending mechanics.  Baseline: see above Goal status: INITIAL   4.  Patient will be independent with advanced home program to progress/maintain current level of function.  Baseline: see above Goal status: INITIAL   5.  Patient will score at least 66% on FOTO to signify clinically meaningful improvement in functional abilities.  Baseline: see above Goal status: new      PLAN:  PT FREQUENCY: 1-2x/week   PT DURATION: 6 weeks   PLANNED INTERVENTIONS: Therapeutic exercises, Therapeutic activity, Neuromuscular re-education, Balance training, Patient/Family education, Self Care, Joint mobilization, Dry Needling, Electrical stimulation, Spinal manipulation, Spinal mobilization, Cryotherapy, Moist heat, Taping, Manual therapy, and Re-evaluation.   PLAN FOR NEXT SESSION:  review and progress HEP prn; trunk mobility all planes; TPDN response, hamstring stretching, postural strengthening     Hilda Blades, PT, DPT, LAT, ATC 03/14/23  9:37 AM Phone: [phone] Fax: [phone]

## 2023-03-14 ENCOUNTER — Other Ambulatory Visit: Payer: Self-pay

## 2023-03-14 ENCOUNTER — Encounter: Payer: Self-pay | Admitting: Physical Therapy

## 2023-03-14 ENCOUNTER — Ambulatory Visit: Payer: Medicare Other | Admitting: Physical Therapy

## 2023-03-14 DIAGNOSIS — M6281 Muscle weakness (generalized): Secondary | ICD-10-CM

## 2023-03-14 DIAGNOSIS — M9908 Segmental and somatic dysfunction of rib cage: Secondary | ICD-10-CM

## 2023-03-14 DIAGNOSIS — R29898 Other symptoms and signs involving the musculoskeletal system: Secondary | ICD-10-CM

## 2023-03-14 DIAGNOSIS — R293 Abnormal posture: Secondary | ICD-10-CM

## 2023-03-16 NOTE — Therapy (Signed)
OUTPATIENT PHYSICAL THERAPY TREATMENT NOTE   Patient Name: Steven Sanford MRN: [id number] DOB:02-Jun-1956, 67 y.o., male Today's Date: 03/17/2023  PCP: Janith Lima, MD   REFERRING PROVIDER: Janith Lima, MD     END OF SESSION:   PT End of Session - 03/17/23 0852     Visit Number 4    Number of Visits 13    Date for PT Re-Evaluation 04/15/23    Authorization Type MCR    Progress Note Due on Visit 10    PT Start Time 0845    PT Stop Time 0930    PT Time Calculation (min) 45 min    Activity Tolerance Patient tolerated treatment well    Behavior During Therapy [facility] for tasks assessed/performed               Past Medical History:  Diagnosis Date   GERD (gastroesophageal reflux disease)    Hypertension    Past Surgical History:  Procedure Laterality Date   EYE SURGERY Left 2022   5 operations in total   Patient Active Problem List   Diagnosis Date Noted   Rib cage region somatic dysfunction 02/09/2023   PSA elevation 02/07/2023   Rising PSA level 02/07/2023   Encounter for general adult medical examination with abnormal findings 06/01/2022   Hyperlipidemia LDL goal <100 06/01/2022   Gastroesophageal reflux disease without esophagitis 06/01/2022   Primary hypertension 06/01/2022   Benign prostatic hyperplasia with urinary hesitancy 06/01/2022   Class 1 obesity due to excess calories with serious comorbidity and body mass index (BMI) of 31.0 to 31.9 in adult 06/01/2022   Peripheral sensory neuropathy 06/01/2022   Polyp of colon 06/01/2022   Erectile dysfunction due to arterial insufficiency 06/01/2022   Pure hypercholesterolemia, unspecified 04/17/2015    REFERRING DIAG: : M99.08 (ICD-10-CM) - Rib cage region somatic dysfunction    THERAPY DIAG:  Rib cage region somatic dysfunction  Other symptoms and signs involving the musculoskeletal system  Abnormal posture  Muscle weakness (generalized)  Rationale for Evaluation and Treatment  Rehabilitation  PERTINENT HISTORY: hypertension, GERD  PRECAUTIONS: none   SUBJECTIVE:                                                                                                                                                                                     SUBJECTIVE STATEMENT:  Patient reports he was sore following last visit. States he is feeling pretty good. He is having a little pain around the left shoulder blade and area upper spine.  PAIN:  Are you having pain? Yes:  NPRS scale: 1/10 Pain location: Left shoulder blade and thoracic / neck region Pain description: Sore /  bruise Aggravating factors: Hot shower; laying on Left side Relieving factors: Cold   OBJECTIVE: (objective measures completed at initial evaluation unless otherwise dated) PATIENT SURVEYS:  03/01/23: FOTO: 51% function to 66% predicted    MUSCLE LENGTH: Hamstrings: Right lacking 55 deg; Left lacking 55 deg    POSTURE:  Decreased lumbar lordosis and decreased thoracic kyphosis   PALPATION: Hypomobility throughout T-spine and L-spine Tautness and palpable tenderness bilateral thoracic paraspinals  No palpable tenderness with rib or sternal palpation    LUMBAR ROM:    AROM eval  Flexion 75% limited  Extension 50% limited  Right lateral flexion 25% limited  Left lateral flexion 25% limited  Right rotation 25% limited   Left rotation 25% limited pain   (Blank rows = not tested)   LOWER EXTREMITY ROM:      Active  Right eval Left eval  Hip flexion      Hip extension      Hip abduction      Hip adduction      Hip internal rotation      Hip external rotation      Knee flexion      Knee extension      Ankle dorsiflexion      Ankle plantarflexion      Ankle inversion      Ankle eversion       (Blank rows = not tested)   MMT:     MMT Right eval Left eval  Middle Trap  4 4  Hip extension 4 4  Hip abduction      Hip adduction      Hip internal rotation      Hip external  rotation      Knee flexion      Knee extension      Ankle dorsiflexion      Ankle plantarflexion      Ankle inversion      Ankle eversion       (Blank rows = not tested)   GAIT: Distance walked: 10 ft  Assistive device utilized: None Level of assistance: Complete Independence Comments: WNL   TODAY'S TREATMENT [facility] Adult PT Treatment:                                                DATE: 03/17/2023 Therapeutic Exercise: UBE L3 x 2 min bwd while taking subjective Sidelying thoracic rotation x 10 each Quadruped on forearms thoracic rotation x 10 each Seated thoracic extension x 10 Seated hamstring stretch x 30 sec each Supine thoracic extension over towel roll x 10 - demo for HEP Manual Therapy: Skilled palpation and monitoring of muscle tension while performing TPDN STM / TPR left neck and shoulder region Prone thoracic extension thrust manipulation Trigger Point Dry Needling Treatment: Pre-treatment instruction: Patient instructed on dry needling rationale, procedures, and possible side effects including pain during treatment (achy,cramping feeling), bruising, drop of blood, lightheadedness, nausea, sweating. Patient Consent Given: Yes Education handout provided: Yes Muscles treated: Left upper trap, bilateral rhomboids/mid trap region Needle size and number: .30x58mm x 6 Electrical stimulation performed: No Parameters: N/A Treatment response/outcome: Twitch response elicited and Palpable decrease in muscle tension Post-treatment instructions: Patient instructed to expect possible mild to moderate muscle soreness later today and/or tomorrow. Patient instructed in methods to reduce muscle soreness and to continue prescribed HEP. If patient was dry  needled over the lung field, patient was instructed on signs and symptoms of pneumothorax and, however unlikely, to see immediate medical attention should they occur. Patient was also educated on signs and symptoms of infection and to seek  medical attention should they occur. Patient verbalized understanding of these instructions and education.   [facility] Adult PT Treatment:                                                DATE: 03/14/2023 Therapeutic Exercise: UBE L1 x 4 min (fwd/bwd) while taking subjective Sidelying thoracic rotation x 5 each Seated thoracic extension x 5 Supine thoracic extension over FR x 5 Step back thoracic extension stretch at counter x 5 Seated upper trap and levator scap stretch 2 x 15 sec each Row with FM 13# x 10 Manual Therapy: Skilled palpation and monitoring of muscle tension while performing TPDN STM left neck and shoulder region Trigger Point Dry Needling Treatment: Pre-treatment instruction: Patient instructed on dry needling rationale, procedures, and possible side effects including pain during treatment (achy,cramping feeling), bruising, drop of blood, lightheadedness, nausea, sweating. Patient Consent Given: Yes Education handout provided: Yes Muscles treated: Left upper trap, bilateral rhomboids/mid trap region, left infraspinatus Needle size and number: .30x66mm x 6 Electrical stimulation performed: No Parameters: N/A Treatment response/outcome: Twitch response elicited and Palpable decrease in muscle tension Post-treatment instructions: Patient instructed to expect possible mild to moderate muscle soreness later today and/or tomorrow. Patient instructed in methods to reduce muscle soreness and to continue prescribed HEP. If patient was dry needled over the lung field, patient was instructed on signs and symptoms of pneumothorax and, however unlikely, to see immediate medical attention should they occur. Patient was also educated on signs and symptoms of infection and to seek medical attention should they occur. Patient verbalized understanding of these instructions and education.  [facility] Adult PT Treatment:                                                DATE: 03/01/23 Therapeutic Exercise: LTR  x 60 seconds Diaphragmatic breathing x 5 Seated HS stretch x 30 sec each  Pec doorway stretch 2x 30 sec  Sidelying thoracic rotation  x 5 Manual Therapy: STM bilateral thoracic paraspinals, Lt rhomboids, traps CPAs Thoracic spine grade II-III STM Lt pectoralis major/minor Skilled palpation of trigger points and monitoring throughout TPDN.  Trigger Point Dry Needling Treatment: Pre-treatment instruction: Patient instructed on dry needling rationale, procedures, and possible side effects including pain during treatment (achy,cramping feeling), bruising, drop of blood, lightheadedness, nausea, sweating. Patient Consent Given: Yes Education handout provided: Yes Muscles treated: Lt thoracic parapinals, Lt rhomoids, Lt pectoralis major/minor   Needle size and number: .30x80mm x 1 and .25x67mm x 2 Electrical stimulation performed: No Parameters: N/A Treatment response/outcome: Twitch response elicited and Palpable decrease in muscle tension Post-treatment instructions: Patient instructed to expect possible mild to moderate muscle soreness later today and/or tomorrow. Patient instructed in methods to reduce muscle soreness and to continue prescribed HEP. If patient was dry needled over the lung field, patient was instructed on signs and symptoms of pneumothorax and, however unlikely, to see immediate medical attention should they occur. Patient was also educated on signs and symptoms of infection  and to seek medical attention should they occur. Patient verbalized understanding of these instructions and education.   PATIENT EDUCATION:  Education details: HEP update, TPDN Person educated: Patient Education method: Explanation, Demonstration, Tactile cues, Verbal cues, Handout Education comprehension: verbalized understanding, returned demonstration, verbal cues required, tactile cues required, and needs further education   HOME EXERCISE PROGRAM: Access Code: [email or web address]   ASSESSMENT: CLINICAL  IMPRESSION: Patient tolerated therapy well with no adverse effects. Therapy continues to focus on progressing thoracic mobility and postural control with good tolerance. Performed TPDN for left periscapular region and neck with multiple twitch responses and patient reporting improvement in symptoms. He does continue to exhibit thoracic hypomobility so performed manipulation with good tolerance. Updated his HEP to progress thoracic extension mobility. Patient would benefit from continued skilled PT to progress his mobility and strength in order to reduce pain and maximize functional mobility.   OBJECTIVE IMPAIRMENTS: decreased knowledge of condition, decreased ROM, decreased strength, hypomobility, increased fascial restrictions, impaired flexibility, improper body mechanics, postural dysfunction, and pain.    ACTIVITY LIMITATIONS: carrying, lifting, bending, and squatting   PARTICIPATION LIMITATIONS: meal prep, cleaning, laundry, shopping, community activity, and yard work   PERSONAL FACTORS: Age and Fitness are also affecting patient's functional outcome.     GOALS: Goals reviewed with patient? Yes   SHORT TERM GOALS: Target date: 03/20/2023   Patient will be independent and compliant with initial HEP.   Baseline: see above Goal status: INITIAL   2.  Therapist will capture FOTO and set appropriate LTG.  Baseline: not set up at evaluation.  Goal status: met   3.  Patient will report pain free trunk AROM to improve his ability to complete reaching and bending activity.  Baseline: see above  Goal status: INITIAL   4.  Patient will improve hamstring flexibility by at least 10 degrees bilaterally to reduce stress on the back with bending activity.  Baseline: see above  Goal status: INITIAL   LONG TERM GOALS: Target date: 04/10/2023   Patient will report pain at worst rated as </= 3/10 to reduce current functional limitations.  Baseline: see above Goal status: INITIAL   2.  Patient  will demonstrate 5/5 middle trap strength to improve postural stability.  Baseline: see above Goal status: INITIAL   3.  Patient will improve trunk flexion AROM by at least 25% to improve bending mechanics.  Baseline: see above Goal status: INITIAL   4.  Patient will be independent with advanced home program to progress/maintain current level of function.  Baseline: see above Goal status: INITIAL   5.  Patient will score at least 66% on FOTO to signify clinically meaningful improvement in functional abilities.  Baseline: see above Goal status: new      PLAN: PT FREQUENCY: 1-2x/week   PT DURATION: 6 weeks   PLANNED INTERVENTIONS: Therapeutic exercises, Therapeutic activity, Neuromuscular re-education, Balance training, Patient/Family education, Self Care, Joint mobilization, Dry Needling, Electrical stimulation, Spinal manipulation, Spinal mobilization, Cryotherapy, Moist heat, Taping, Manual therapy, and Re-evaluation.   PLAN FOR NEXT SESSION:  review and progress HEP prn; trunk mobility all planes; TPDN response, hamstring stretching, postural strengthening     Hilda Blades, PT, DPT, LAT, ATC 03/17/23  10:22 AM Phone: [phone] Fax: [phone]

## 2023-03-17 ENCOUNTER — Ambulatory Visit: Payer: Medicare Other | Admitting: Physical Therapy

## 2023-03-17 ENCOUNTER — Other Ambulatory Visit: Payer: Self-pay

## 2023-03-17 ENCOUNTER — Encounter: Payer: Self-pay | Admitting: Physical Therapy

## 2023-03-17 DIAGNOSIS — R29898 Other symptoms and signs involving the musculoskeletal system: Secondary | ICD-10-CM

## 2023-03-17 DIAGNOSIS — M9908 Segmental and somatic dysfunction of rib cage: Secondary | ICD-10-CM | POA: Diagnosis not present

## 2023-03-17 DIAGNOSIS — R293 Abnormal posture: Secondary | ICD-10-CM

## 2023-03-17 DIAGNOSIS — M6281 Muscle weakness (generalized): Secondary | ICD-10-CM

## 2023-03-17 NOTE — Patient Instructions (Signed)
Access Code: [email or web address] URL: [email or web address] Date: 03/17/2023 Prepared by: Hilda Blades  Exercises - Supine Lower Trunk Rotation  - 1-2 x daily - 7 x weekly - 1 sets - 10 reps - Sidelying Thoracic Rotation with Open Book  - 1-2 x daily - 7 x weekly - 1 sets - 10 reps - Supine Diaphragmatic Breathing  - 1-2 x daily - 7 x weekly - 1 sets - 10 reps - Seated Hamstring Stretch  - 1-2 x daily - 7 x weekly - 3 sets - 30 sec  hold - Doorway Pec Stretch at 90 Degrees Abduction  - 1-2 x daily - 7 x weekly - 3 sets - 30 sec  hold - Thoracic Mobilization on Foam Roll - Hands Clasped  - 1-2 x daily - 7 x weekly - 10 reps

## 2023-03-21 ENCOUNTER — Ambulatory Visit: Payer: Medicare Other

## 2023-03-21 NOTE — Progress Notes (Unsigned)
Cardiology Office Note:    Date:  03/23/2023   ID:  Steven Sanford, DOB [birth year], MRN [id number]  PCP:  Janith Lima, MD  Cardiologist:  Donato Heinz, MD  Electrophysiologist:  None   Referring MD: Janith Lima, MD   Chief Complaint  Patient presents with   Palpitations    History of Present Illness:    Steven Sanford is a 67 y.o. male with a hx of hypertension, hyperlipidemia, obesity who presents for follow-up.  He was referred by Dr. Ronnald Ramp for evaluation of tachycardia and hypertension, initially seen on 06/21/2022.  He recently moved from [place].  He was recently told to stop his amlodipine but he thinks he is still taking.  He is unsure of his medications.  He denies any chest pain, dyspnea, lower extremity edema.  He walks daily for 20 minutes.  Reports some lightheadedness with standing but denies any syncope.  Has issues with leg pain/numbness.  Has been having palpitations but improved with decreasing caffeine intake.  He has recently been drinking more caffeine, up to 2 cups coffee per day and tea.  Reports he checks his pulse at home and will run over 100s at times.  Smoked cigarettes in high school but none since.  Smokes cigars, about 1 to 2/year.  No known family history of heart disease.  Calcium score 0 on 06/24/2022.  Calcium score also notable for small pulmonary nodules measuring up to 5 mm.  Zio patch x 8 days 06/2022 showed 12 episodes of SVT, longest lasting 19 beats with average rate 132 bpm  Since last clinic visit, he reports he is doing well.  Denies any chest pain, dyspnea, lower extremity edema.  Has lost 40 pounds.  She reported had some lightheadedness and soft blood pressures, but recently has been improved.  Denies any syncope.  Reports occasional palpitations.  Cut back on caffeine and his palpitations have improved.  Walks 1 to 2 miles 4 to 5 days/week. .  Past Medical History:  Diagnosis Date   GERD (gastroesophageal reflux disease)     Hypertension     Past Surgical History:  Procedure Laterality Date   EYE SURGERY Left 2022   5 operations in total    Current Medications: Current Meds  Medication Sig   amLODipine (NORVASC) 5 MG tablet Take 5 mg by mouth daily.   atorvastatin (LIPITOR) 40 MG tablet Take 1 tablet (40 mg total) by mouth daily.   enalapril (VASOTEC) 10 MG tablet Take 1 tablet (10 mg total) by mouth daily.   tadalafil (CIALIS) 5 MG tablet Take 1 tablet (5 mg total) by mouth daily. (Patient taking differently: Take 5 mg by mouth as needed for erectile dysfunction.)     Allergies:   Penicillins   Social History   Socioeconomic History   Marital status: Married    Spouse name: Not on file   Number of children: Not on file   Years of education: Not on file   Highest education level: Not on file  Occupational History   Not on file  Tobacco Use   Smoking status: Never    Passive exposure: Never   Smokeless tobacco: Never  Substance and Sexual Activity   Alcohol use: Not Currently    Alcohol/week: 2.0 - 4.0 standard drinks of alcohol    Types: 2 - 4 Standard drinks or equivalent per week   Drug use: Never   Sexual activity: Yes    Partners: Female  Other Topics Concern  Not on file  Social History Narrative   Not on file   Social Determinants of Health   Financial Resource Strain: Low Risk  (06/21/2022)   Overall Financial Resource Strain (CARDIA)    Difficulty of Paying Living Expenses: Not hard at all  Food Insecurity: No Food Insecurity (06/21/2022)   Hunger Vital Sign    Worried About Running Out of Food in the Last Year: Never true    Ran Out of Food in the Last Year: Never true  Transportation Needs: No Transportation Needs (06/21/2022)   PRAPARE - Hydrologist (Medical): No    Lack of Transportation (Non-Medical): No  Physical Activity: Insufficiently Active (06/21/2022)   Exercise Vital Sign    Days of Exercise per Week: 3 days    Minutes of  Exercise per Session: 30 min  Stress: Not on file  Social Connections: Not on file     Family History: The patient's family history includes Alcoholism in his father and mother. There is no history of Colon cancer, Esophageal cancer, Liver cancer, Pancreatic cancer, Rectal cancer, or Stomach cancer.  ROS:   Please see the history of present illness.     All other systems reviewed and are negative.  EKGs/Labs/Other Studies Reviewed:    The following studies were reviewed today:   EKG:   06/21/2022: Normal sinus rhythm, rate 96, low voltage, poor R wave progression  Recent Labs: 01/20/2023: ALT 13; BUN 15; Creatinine, Ser 1.00; Hemoglobin 14.8; Platelets 207; Potassium 4.0; Sodium 137; TSH 1.314  Recent Lipid Panel    Component Value Date/Time   CHOL 159 06/01/2022 0937   TRIG 79.0 06/01/2022 0937   HDL 63.70 06/01/2022 0937   CHOLHDL 2 06/01/2022 0937   VLDL 15.8 06/01/2022 0937   LDLCALC 79 06/01/2022 0937   LDLCALC 83 03/25/2021 0930    Physical Exam:    VS:  BP 110/60   Pulse 95   Ht 6' (1.829 m)   Wt 188 lb (85.3 kg)   SpO2 98%   BMI 25.50 kg/m     Wt Readings from Last 3 Encounters:  03/23/23 188 lb (85.3 kg)  02/07/23 190 lb (86.2 kg)  09/01/22 204 lb (92.5 kg)     GEN:  Well nourished, well developed in no acute distress HEENT: Normal NECK: No JVD; No carotid bruits LYMPHATICS: No lymphadenopathy CARDIAC: RRR, no murmurs, rubs, gallops RESPIRATORY:  Clear to auscultation without rales, wheezing or rhonchi  ABDOMEN: Soft, non-tender, non-distended MUSCULOSKELETAL:  No edema; No deformity  SKIN: Warm and dry NEUROLOGIC:  Alert and oriented x 3 PSYCHIATRIC:  Normal affect   ASSESSMENT:    1. Palpitations   2. Essential hypertension   3. Hyperlipidemia, unspecified hyperlipidemia type   4. Pulmonary nodules      PLAN:    Tachycardia/palpitations: Reports resting heart rate has been over 100.  Also is having issues with palpitations but improved  with decreasing caffeine use.  Zio patch x 8 days 06/2022 showed 12 episodes of SVT, longest lasting 19 beats with average rate 132 bpm -Reports palpitations have improved, seems related to caffeine  Hypertension: On enalapril 10 mg daily and amlodipine 5 mg daily.  Appears controlled.  Leg pain/numbness: Normal ABIs 06/2022.  Hyperlipidemia: On atorvastatin 40 mg daily.  LDL 79 on 06/01/22.  Calcium score 0 on 06/24/2022.    Pulmonary nodules: Calcium score 06/24/2022 notable for small pulmonary nodules measuring up to 5 mm.  Plan repeat chest CT in  1 year to follow-up  RTC in 1 year  Medication Adjustments/Labs and Tests Ordered: Current medicines are reviewed at length with the patient today.  Concerns regarding medicines are outlined above.  No orders of the defined types were placed in this encounter.  No orders of the defined types were placed in this encounter.   Patient Instructions  Medication Instructions:  Your physician recommends that you continue on your current medications as directed. Please refer to the Current Medication list given to you today.  *If you need a refill on your cardiac medications before your next appointment, please call your pharmacy*  Follow-Up: At [facility], you and your health needs are our priority.  As part of our continuing mission to provide you with exceptional heart care, we have created designated Provider Care Teams.  These Care Teams include your primary Cardiologist (physician) and Advanced Practice Providers (APPs -  Physician Assistants and Nurse Practitioners) who all work together to provide you with the care you need, when you need it.  We recommend signing up for the patient portal called "MyChart".  Sign up information is provided on this After Visit Summary.  MyChart is used to connect with patients for Virtual Visits (Telemedicine).  Patients are able to view lab/test results, encounter notes, upcoming appointments, etc.   Non-urgent messages can be sent to your provider as well.   To learn more about what you can do with MyChart, go to [email or web address].    Your next appointment:   12 month(s)  Provider:   Dr. Gardiner Rhyme    Signed, Donato Heinz, MD  03/23/2023 11:33 PM    [place]

## 2023-03-22 NOTE — Therapy (Signed)
OUTPATIENT PHYSICAL THERAPY TREATMENT NOTE   Patient Name: Steven Sanford MRN: [id number] DOB:11/24/1956, 67 y.o., male Today's Date: 03/23/2023  PCP: Janith Lima, MD   REFERRING PROVIDER: Janith Lima, MD     END OF SESSION:   PT End of Session - 03/23/23 0907     Visit Number 5    Number of Visits 13    Date for PT Re-Evaluation 04/15/23    Authorization Type MCR    Progress Note Due on Visit 10    PT Start Time [id number]    PT Stop Time 0923    PT Time Calculation (min) 36 min    Activity Tolerance Patient tolerated treatment well    Behavior During Therapy [facility] for tasks assessed/performed                Past Medical History:  Diagnosis Date   GERD (gastroesophageal reflux disease)    Hypertension    Past Surgical History:  Procedure Laterality Date   EYE SURGERY Left 2022   5 operations in total   Patient Active Problem List   Diagnosis Date Noted   Rib cage region somatic dysfunction 02/09/2023   PSA elevation 02/07/2023   Rising PSA level 02/07/2023   Encounter for general adult medical examination with abnormal findings 06/01/2022   Hyperlipidemia LDL goal <100 06/01/2022   Gastroesophageal reflux disease without esophagitis 06/01/2022   Primary hypertension 06/01/2022   Benign prostatic hyperplasia with urinary hesitancy 06/01/2022   Class 1 obesity due to excess calories with serious comorbidity and body mass index (BMI) of 31.0 to 31.9 in adult 06/01/2022   Peripheral sensory neuropathy 06/01/2022   Polyp of colon 06/01/2022   Erectile dysfunction due to arterial insufficiency 06/01/2022   Pure hypercholesterolemia, unspecified 04/17/2015    REFERRING DIAG: : M99.08 (ICD-10-CM) - Rib cage region somatic dysfunction    THERAPY DIAG:  Rib cage region somatic dysfunction  Other symptoms and signs involving the musculoskeletal system  Abnormal posture  Muscle weakness (generalized)  Rationale for Evaluation and Treatment  Rehabilitation  PERTINENT HISTORY: hypertension, GERD  PRECAUTIONS: None    SUBJECTIVE:                                                                                                                                                                                     SUBJECTIVE STATEMENT:  Patient reports he did have some increase tightness of the nec and shoulder region over the weekend. He had to cancel his previous appointment due to conflict with other things he had to take care of. He is wondering if his recent accident may have aggravated his neck pain in the short  term, he did not have pain initially but has noticed some increased tightness and pain.   PAIN:  Are you having pain? Yes:  NPRS scale: 3/10 Pain location: Left shoulder blade and thoracic / neck region Pain description: Sore / bruise Aggravating factors: Hot shower; laying on Left side Relieving factors: Cold   OBJECTIVE: (objective measures completed at initial evaluation unless otherwise dated) PATIENT SURVEYS:  03/01/23: FOTO: 51% function to 66% predicted    MUSCLE LENGTH: Hamstrings: Right lacking 55 deg; Left lacking 55 deg    POSTURE:  Decreased lumbar lordosis and decreased thoracic kyphosis   PALPATION: Hypomobility throughout T-spine and L-spine Tautness and palpable tenderness bilateral thoracic paraspinals  No palpable tenderness with rib or sternal palpation    LUMBAR ROM:    AROM eval  Flexion 75% limited  Extension 50% limited  Right lateral flexion 25% limited  Left lateral flexion 25% limited  Right rotation 25% limited   Left rotation 25% limited pain   (Blank rows = not tested)   LOWER EXTREMITY ROM:      Active  Right eval Left eval  Hip flexion      Hip extension      Hip abduction      Hip adduction      Hip internal rotation      Hip external rotation      Knee flexion      Knee extension      Ankle dorsiflexion      Ankle plantarflexion      Ankle inversion       Ankle eversion       (Blank rows = not tested)   MMT:     MMT Right eval Left eval  Middle Trap  4 4  Hip extension 4 4  Hip abduction      Hip adduction      Hip internal rotation      Hip external rotation      Knee flexion      Knee extension      Ankle dorsiflexion      Ankle plantarflexion      Ankle inversion      Ankle eversion       (Blank rows = not tested)   GAIT: Distance walked: 10 ft  Assistive device utilized: None Level of assistance: Complete Independence Comments: WNL   TODAY'S TREATMENT [facility] Adult PT Treatment:                                                DATE: 03/23/2023 Therapeutic Exercise: UBE L3 x 2 min bwd while taking subjective Sidelying thoracic rotation x 10 each Supine thoracic extension over FR x 10 Seated double ER and scap retraction with green Seated horizontal abduction with green x 10 Row with black x 10 Extension with green x 10 Doorway pec stretch at 90 deg 3 x 20 sec Chin tuck at wall 10 x 5 sec hold Manual Therapy: Skilled palpation and monitoring of muscle tension while performing TPDN STM / TPR left neck and shoulder region Trigger Point Dry Needling Treatment: Pre-treatment instruction: Patient instructed on dry needling rationale, procedures, and possible side effects including pain during treatment (achy,cramping feeling), bruising, drop of blood, lightheadedness, nausea, sweating. Patient Consent Given: Yes Education handout provided: Yes Muscles treated: Left upper trap, bilateral rhomboids/mid trap region, infraspinatus  Needle size and number: .30x46mm x 6 Electrical stimulation performed: No Parameters: N/A Treatment response/outcome: Twitch response elicited and Palpable decrease in muscle tension Post-treatment instructions: Patient instructed to expect possible mild to moderate muscle soreness later today and/or tomorrow. Patient instructed in methods to reduce muscle soreness and to continue prescribed HEP. If  patient was dry needled over the lung field, patient was instructed on signs and symptoms of pneumothorax and, however unlikely, to see immediate medical attention should they occur. Patient was also educated on signs and symptoms of infection and to seek medical attention should they occur. Patient verbalized understanding of these instructions and education.   [facility] Adult PT Treatment:                                                DATE: 03/17/2023 Therapeutic Exercise: UBE L3 x 2 min bwd while taking subjective Sidelying thoracic rotation x 10 each Quadruped on forearms thoracic rotation x 10 each Seated thoracic extension x 10 Seated hamstring stretch x 30 sec each Supine thoracic extension over towel roll x 10 - demo for HEP Manual Therapy: Skilled palpation and monitoring of muscle tension while performing TPDN STM / TPR left neck and shoulder region Prone thoracic extension thrust manipulation Trigger Point Dry Needling Treatment: Pre-treatment instruction: Patient instructed on dry needling rationale, procedures, and possible side effects including pain during treatment (achy,cramping feeling), bruising, drop of blood, lightheadedness, nausea, sweating. Patient Consent Given: Yes Education handout provided: Yes Muscles treated: Left upper trap, bilateral rhomboids/mid trap region Needle size and number: .30x7mm x 6 Electrical stimulation performed: No Parameters: N/A Treatment response/outcome: Twitch response elicited and Palpable decrease in muscle tension Post-treatment instructions: Patient instructed to expect possible mild to moderate muscle soreness later today and/or tomorrow. Patient instructed in methods to reduce muscle soreness and to continue prescribed HEP. If patient was dry needled over the lung field, patient was instructed on signs and symptoms of pneumothorax and, however unlikely, to see immediate medical attention should they occur. Patient was also educated on  signs and symptoms of infection and to seek medical attention should they occur. Patient verbalized understanding of these instructions and education.  [facility] Adult PT Treatment:                                                DATE: 03/14/2023 Therapeutic Exercise: UBE L1 x 4 min (fwd/bwd) while taking subjective Sidelying thoracic rotation x 5 each Seated thoracic extension x 5 Supine thoracic extension over FR x 5 Step back thoracic extension stretch at counter x 5 Seated upper trap and levator scap stretch 2 x 15 sec each Row with FM 13# x 10 Manual Therapy: Skilled palpation and monitoring of muscle tension while performing TPDN STM left neck and shoulder region Trigger Point Dry Needling Treatment: Pre-treatment instruction: Patient instructed on dry needling rationale, procedures, and possible side effects including pain during treatment (achy,cramping feeling), bruising, drop of blood, lightheadedness, nausea, sweating. Patient Consent Given: Yes Education handout provided: Yes Muscles treated: Left upper trap, bilateral rhomboids/mid trap region, left infraspinatus Needle size and number: .30x66mm x 6 Electrical stimulation performed: No Parameters: N/A Treatment response/outcome: Twitch response elicited and Palpable decrease in muscle tension Post-treatment instructions: Patient  instructed to expect possible mild to moderate muscle soreness later today and/or tomorrow. Patient instructed in methods to reduce muscle soreness and to continue prescribed HEP. If patient was dry needled over the lung field, patient was instructed on signs and symptoms of pneumothorax and, however unlikely, to see immediate medical attention should they occur. Patient was also educated on signs and symptoms of infection and to seek medical attention should they occur. Patient verbalized understanding of these instructions and education.   PATIENT EDUCATION:  Education details: HEP update, TPDN Person  educated: Patient Education method: Explanation, Demonstration, Tactile cues, Verbal cues, Handout Education comprehension: verbalized understanding, returned demonstration, verbal cues required, tactile cues required, and needs further education   HOME EXERCISE PROGRAM: Access Code: [email or web address]   ASSESSMENT: CLINICAL IMPRESSION: Patient tolerated therapy well with no adverse effects. Therapy continued with TPDN for left neck and periscapular region with good therapeutic benefit and report of improved tightness. Therapy continues to focus on improving spinal mobility and progressing his postural control with good tolerance. He does continue to exhibit thoracic stiffness and postural deviations including rounded shoulder and forward head posture. Updated HEP to add chin tuck at wall for postural improvement and DNF endurance. Patient would benefit from continued skilled PT to progress his mobility and strength in order to reduce pain and maximize functional mobility.   OBJECTIVE IMPAIRMENTS: decreased knowledge of condition, decreased ROM, decreased strength, hypomobility, increased fascial restrictions, impaired flexibility, improper body mechanics, postural dysfunction, and pain.    ACTIVITY LIMITATIONS: carrying, lifting, bending, and squatting   PARTICIPATION LIMITATIONS: meal prep, cleaning, laundry, shopping, community activity, and yard work   PERSONAL FACTORS: Age and Fitness are also affecting patient's functional outcome.     GOALS: Goals reviewed with patient? Yes   SHORT TERM GOALS: Target date: 03/20/2023   Patient will be independent and compliant with initial HEP.   Baseline: see above 03/23/2023: independent Goal status: MET   2.  Therapist will capture FOTO and set appropriate LTG.  Baseline: not set up at evaluation.  03/23/2023: captured Goal status: MET   3.  Patient will report pain free trunk AROM to improve his ability to complete reaching and bending activity.   Baseline: see above  03/23/2023: continues to report pain Goal status: ONGOING   4.  Patient will improve hamstring flexibility by at least 10 degrees bilaterally to reduce stress on the back with bending activity.  Baseline: see above  03/23/2023: not assessed Goal status: DEFERRED   LONG TERM GOALS: Target date: 04/10/2023   Patient will report pain at worst rated as </= 3/10 to reduce current functional limitations.  Baseline: see above Goal status: INITIAL   2.  Patient will demonstrate 5/5 middle trap strength to improve postural stability.  Baseline: see above Goal status: INITIAL   3.  Patient will improve trunk flexion AROM by at least 25% to improve bending mechanics.  Baseline: see above Goal status: INITIAL   4.  Patient will be independent with advanced home program to progress/maintain current level of function.  Baseline: see above Goal status: INITIAL   5.  Patient will score at least 66% on FOTO to signify clinically meaningful improvement in functional abilities.  Baseline: see above Goal status: new      PLAN: PT FREQUENCY: 1-2x/week   PT DURATION: 6 weeks   PLANNED INTERVENTIONS: Therapeutic exercises, Therapeutic activity, Neuromuscular re-education, Balance training, Patient/Family education, Self Care, Joint mobilization, Dry Needling, Electrical stimulation, Spinal manipulation, Spinal mobilization, Cryotherapy, Moist heat,  Taping, Manual therapy, and Re-evaluation.   PLAN FOR NEXT SESSION:  review and progress HEP prn; trunk mobility all planes; TPDN response, hamstring stretching, postural strengthening     Hilda Blades, PT, DPT, LAT, ATC 03/23/23  9:38 AM Phone: [phone] Fax: [phone]

## 2023-03-23 ENCOUNTER — Encounter: Payer: Self-pay | Admitting: Physical Therapy

## 2023-03-23 ENCOUNTER — Ambulatory Visit (HOSPITAL_BASED_OUTPATIENT_CLINIC_OR_DEPARTMENT_OTHER): Payer: Medicare Other | Admitting: Cardiology

## 2023-03-23 ENCOUNTER — Encounter (HOSPITAL_BASED_OUTPATIENT_CLINIC_OR_DEPARTMENT_OTHER): Payer: Self-pay | Admitting: Cardiology

## 2023-03-23 ENCOUNTER — Ambulatory Visit: Payer: Medicare Other | Admitting: Physical Therapy

## 2023-03-23 ENCOUNTER — Other Ambulatory Visit: Payer: Self-pay

## 2023-03-23 VITALS — BP 110/60 | HR 95 | Ht 72.0 in | Wt 188.0 lb

## 2023-03-23 DIAGNOSIS — R918 Other nonspecific abnormal finding of lung field: Secondary | ICD-10-CM

## 2023-03-23 DIAGNOSIS — M9908 Segmental and somatic dysfunction of rib cage: Secondary | ICD-10-CM | POA: Diagnosis not present

## 2023-03-23 DIAGNOSIS — M6281 Muscle weakness (generalized): Secondary | ICD-10-CM

## 2023-03-23 DIAGNOSIS — R002 Palpitations: Secondary | ICD-10-CM | POA: Diagnosis not present

## 2023-03-23 DIAGNOSIS — R293 Abnormal posture: Secondary | ICD-10-CM

## 2023-03-23 DIAGNOSIS — I10 Essential (primary) hypertension: Secondary | ICD-10-CM | POA: Diagnosis not present

## 2023-03-23 DIAGNOSIS — E785 Hyperlipidemia, unspecified: Secondary | ICD-10-CM | POA: Diagnosis not present

## 2023-03-23 DIAGNOSIS — R29898 Other symptoms and signs involving the musculoskeletal system: Secondary | ICD-10-CM

## 2023-03-23 NOTE — Patient Instructions (Signed)
Access Code: [email or web address] URL: [email or web address] Date: 03/23/2023 Prepared by: Hilda Blades  Exercises - Supine Lower Trunk Rotation  - 1-2 x daily - 7 x weekly - 1 sets - 10 reps - Sidelying Thoracic Rotation with Open Book  - 1-2 x daily - 7 x weekly - 1 sets - 10 reps - Supine Diaphragmatic Breathing  - 1-2 x daily - 7 x weekly - 1 sets - 10 reps - Seated Hamstring Stretch  - 1-2 x daily - 7 x weekly - 3 sets - 30 sec  hold - Doorway Pec Stretch at 90 Degrees Abduction  - 1-2 x daily - 7 x weekly - 3 sets - 30 sec  hold - Thoracic Mobilization on Foam Roll - Hands Clasped  - 1-2 x daily - 7 x weekly - 10 reps - Cervical Retraction at Wall  - 2 x daily - 10 reps - 5 seconds hold

## 2023-03-23 NOTE — Patient Instructions (Signed)
Medication Instructions:  Your physician recommends that you continue on your current medications as directed. Please refer to the Current Medication list given to you today.  *If you need a refill on your cardiac medications before your next appointment, please call your pharmacy*  Follow-Up: At [facility], you and your health needs are our priority.  As part of our continuing mission to provide you with exceptional heart care, we have created designated Provider Care Teams.  These Care Teams include your primary Cardiologist (physician) and Advanced Practice Providers (APPs -  Physician Assistants and Nurse Practitioners) who all work together to provide you with the care you need, when you need it.  We recommend signing up for the patient portal called "MyChart".  Sign up information is provided on this After Visit Summary.  MyChart is used to connect with patients for Virtual Visits (Telemedicine).  Patients are able to view lab/test results, encounter notes, upcoming appointments, etc.  Non-urgent messages can be sent to your provider as well.   To learn more about what you can do with MyChart, go to [email or web address].    Your next appointment:   12 month(s)  Provider:   Dr. Schumann 

## 2023-03-27 ENCOUNTER — Other Ambulatory Visit: Payer: Self-pay | Admitting: Internal Medicine

## 2023-03-27 NOTE — Therapy (Signed)
OUTPATIENT PHYSICAL THERAPY TREATMENT NOTE   Patient Name: Steven Sanford MRN: [id number] DOB:Dec 26, 1956, 67 y.o., male Today's Date: 03/28/2023  PCP: Janith Lima, MD   REFERRING PROVIDER: Janith Lima, MD     END OF SESSION:   PT End of Session - 03/28/23 0849     Visit Number 6    Number of Visits 13    Date for PT Re-Evaluation 04/15/23    Authorization Type MCR    Progress Note Due on Visit 10    PT Start Time 0845    PT Stop Time 0930    PT Time Calculation (min) 45 min    Activity Tolerance Patient tolerated treatment well    Behavior During Therapy [facility] for tasks assessed/performed                 Past Medical History:  Diagnosis Date   GERD (gastroesophageal reflux disease)    Hypertension    Past Surgical History:  Procedure Laterality Date   EYE SURGERY Left 2022   5 operations in total   Patient Active Problem List   Diagnosis Date Noted   Rib cage region somatic dysfunction 02/09/2023   PSA elevation 02/07/2023   Rising PSA level 02/07/2023   Encounter for general adult medical examination with abnormal findings 06/01/2022   Hyperlipidemia LDL goal <100 06/01/2022   Gastroesophageal reflux disease without esophagitis 06/01/2022   Primary hypertension 06/01/2022   Benign prostatic hyperplasia with urinary hesitancy 06/01/2022   Class 1 obesity due to excess calories with serious comorbidity and body mass index (BMI) of 31.0 to 31.9 in adult 06/01/2022   Peripheral sensory neuropathy 06/01/2022   Polyp of colon 06/01/2022   Erectile dysfunction due to arterial insufficiency 06/01/2022   Pure hypercholesterolemia, unspecified 04/17/2015    REFERRING DIAG: : M99.08 (ICD-10-CM) - Rib cage region somatic dysfunction    THERAPY DIAG:  Rib cage region somatic dysfunction  Other symptoms and signs involving the musculoskeletal system  Abnormal posture  Muscle weakness (generalized)  Rationale for Evaluation and Treatment  Rehabilitation  PERTINENT HISTORY: hypertension, GERD  PRECAUTIONS: None    SUBJECTIVE:                                                                                                                                                                                     SUBJECTIVE STATEMENT:  Patient reports his lower back was bothering him last night.   PAIN:  Are you having pain? Yes:  NPRS scale: 3/10 Pain location: Left shoulder blade and thoracic / neck region Pain description: Sore / bruise Aggravating factors: Hot shower; laying on Left side Relieving factors: Cold  OBJECTIVE: (objective measures completed at initial evaluation unless otherwise dated) PATIENT SURVEYS:  03/01/23: FOTO: 51% function to 66% predicted    MUSCLE LENGTH: Hamstrings: Right lacking 55 deg; Left lacking 55 deg    POSTURE:  Decreased lumbar lordosis and decreased thoracic kyphosis   PALPATION: Hypomobility throughout T-spine and L-spine Tautness and palpable tenderness bilateral thoracic paraspinals  No palpable tenderness with rib or sternal palpation    LUMBAR ROM:    AROM eval  Flexion 75% limited  Extension 50% limited  Right lateral flexion 25% limited  Left lateral flexion 25% limited  Right rotation 25% limited   Left rotation 25% limited pain   (Blank rows = not tested)   LOWER EXTREMITY ROM:      Active  Right eval Left eval  Hip flexion      Hip extension      Hip abduction      Hip adduction      Hip internal rotation      Hip external rotation      Knee flexion      Knee extension      Ankle dorsiflexion      Ankle plantarflexion      Ankle inversion      Ankle eversion       (Blank rows = not tested)   MMT:     MMT Right eval Left eval Rt / Lt 03/28/2023  Middle Trap  4 4 4  / 4  Hip extension 4 4   Hip abduction       Hip adduction       Hip internal rotation       Hip external rotation       Knee flexion       Knee extension       Ankle dorsiflexion        Ankle plantarflexion       Ankle inversion       Ankle eversion        (Blank rows = not tested)   GAIT: Distance walked: 10 ft  Assistive device utilized: None Level of assistance: Complete Independence Comments: WNL   TODAY'S TREATMENT [facility] Adult PT Treatment:                                                DATE: 03/28/2023 Therapeutic Exercise: UBE L3 x 2 min bwd while taking subjective Supine thoracic extension over FR x 10 Sidelying thoracic rotation x 10 each Doorway pec stretch at 90 deg 3 x 20 sec Seated thoracic extension x 10 Seated double ER and scap retraction with blue 2 x 10 Row with FM 17# 2 x 10 Chin tuck at wall 10 x 5 sec hold Manual Therapy: Skilled palpation and monitoring of muscle tension while performing TPDN STM / TPR left neck and shoulder region Trigger Point Dry Needling Treatment: Pre-treatment instruction: Patient instructed on dry needling rationale, procedures, and possible side effects including pain during treatment (achy,cramping feeling), bruising, drop of blood, lightheadedness, nausea, sweating. Patient Consent Given: Yes Education handout provided: Yes Muscles treated: left rhomboids/mid trap region, infraspinatus Needle size and number: .30x71mm x 6 Electrical stimulation performed: No Parameters: N/A Treatment response/outcome: Twitch response elicited and Palpable decrease in muscle tension Post-treatment instructions: Patient instructed to expect possible mild to moderate muscle soreness later today and/or tomorrow. Patient instructed in  methods to reduce muscle soreness and to continue prescribed HEP. If patient was dry needled over the lung field, patient was instructed on signs and symptoms of pneumothorax and, however unlikely, to see immediate medical attention should they occur. Patient was also educated on signs and symptoms of infection and to seek medical attention should they occur. Patient verbalized understanding of these  instructions and education.   [facility] Adult PT Treatment:                                                DATE: 03/23/2023 Therapeutic Exercise: UBE L3 x 2 min bwd while taking subjective Sidelying thoracic rotation x 10 each Supine thoracic extension over FR x 10 Seated double ER and scap retraction with green Seated horizontal abduction with green x 10 Row with black x 10 Extension with green x 10 Doorway pec stretch at 90 deg 3 x 20 sec Chin tuck at wall 10 x 5 sec hold Manual Therapy: Skilled palpation and monitoring of muscle tension while performing TPDN STM / TPR left neck and shoulder region Trigger Point Dry Needling Treatment: Pre-treatment instruction: Patient instructed on dry needling rationale, procedures, and possible side effects including pain during treatment (achy,cramping feeling), bruising, drop of blood, lightheadedness, nausea, sweating. Patient Consent Given: Yes Education handout provided: Yes Muscles treated: Left upper trap, bilateral rhomboids/mid trap region, infraspinatus Needle size and number: .30x67mm x 6 Electrical stimulation performed: No Parameters: N/A Treatment response/outcome: Twitch response elicited and Palpable decrease in muscle tension Post-treatment instructions: Patient instructed to expect possible mild to moderate muscle soreness later today and/or tomorrow. Patient instructed in methods to reduce muscle soreness and to continue prescribed HEP. If patient was dry needled over the lung field, patient was instructed on signs and symptoms of pneumothorax and, however unlikely, to see immediate medical attention should they occur. Patient was also educated on signs and symptoms of infection and to seek medical attention should they occur. Patient verbalized understanding of these instructions and education.  [facility] Adult PT Treatment:                                                DATE: 03/17/2023 Therapeutic Exercise: UBE L3 x 2 min bwd while  taking subjective Sidelying thoracic rotation x 10 each Quadruped on forearms thoracic rotation x 10 each Seated thoracic extension x 10 Seated hamstring stretch x 30 sec each Supine thoracic extension over towel roll x 10 - demo for HEP Manual Therapy: Skilled palpation and monitoring of muscle tension while performing TPDN STM / TPR left neck and shoulder region Prone thoracic extension thrust manipulation Trigger Point Dry Needling Treatment: Pre-treatment instruction: Patient instructed on dry needling rationale, procedures, and possible side effects including pain during treatment (achy,cramping feeling), bruising, drop of blood, lightheadedness, nausea, sweating. Patient Consent Given: Yes Education handout provided: Yes Muscles treated: Left upper trap, bilateral rhomboids/mid trap region Needle size and number: .30x12mm x 6 Electrical stimulation performed: No Parameters: N/A Treatment response/outcome: Twitch response elicited and Palpable decrease in muscle tension Post-treatment instructions: Patient instructed to expect possible mild to moderate muscle soreness later today and/or tomorrow. Patient instructed in methods to reduce muscle soreness and to continue prescribed HEP. If patient was dry  needled over the lung field, patient was instructed on signs and symptoms of pneumothorax and, however unlikely, to see immediate medical attention should they occur. Patient was also educated on signs and symptoms of infection and to seek medical attention should they occur. Patient verbalized understanding of these instructions and education.   PATIENT EDUCATION:  Education details: HEP, TPDN Person educated: Patient Education method: Explanation, Demonstration, Tactile cues, Verbal cues Education comprehension: verbalized understanding, returned demonstration, verbal cues required, tactile cues required, and needs further education   HOME EXERCISE PROGRAM: Access Code:  [email or web address]   ASSESSMENT: CLINICAL IMPRESSION: Patient tolerated therapy well with no adverse effects. Therapy continues to focus on improving spinal mobility and postural control with good tolerance. Continued to perform TPDN for the periscapular region and rotator cuff with good therapeutic benefit. He does continue to exhibit rounded shoulders and forward head posture with periscapular weakness. Therex focused primarily on correcting postural deviations. No changes to HEP this visit. Patient would benefit from continued skilled PT to progress his mobility and strength in order to reduce pain and maximize functional mobility.    OBJECTIVE IMPAIRMENTS: decreased knowledge of condition, decreased ROM, decreased strength, hypomobility, increased fascial restrictions, impaired flexibility, improper body mechanics, postural dysfunction, and pain.    ACTIVITY LIMITATIONS: carrying, lifting, bending, and squatting   PARTICIPATION LIMITATIONS: meal prep, cleaning, laundry, shopping, community activity, and yard work   PERSONAL FACTORS: Age and Fitness are also affecting patient's functional outcome.     GOALS: Goals reviewed with patient? Yes   SHORT TERM GOALS: Target date: 03/20/2023   Patient will be independent and compliant with initial HEP.   Baseline: see above 03/23/2023: independent Goal status: MET   2.  Therapist will capture FOTO and set appropriate LTG.  Baseline: not set up at evaluation.  03/23/2023: captured Goal status: MET   3.  Patient will report pain free trunk AROM to improve his ability to complete reaching and bending activity.  Baseline: see above  03/23/2023: continues to report pain Goal status: ONGOING   4.  Patient will improve hamstring flexibility by at least 10 degrees bilaterally to reduce stress on the back with bending activity.  Baseline: see above  03/23/2023: not assessed Goal status: DEFERRED   LONG TERM GOALS: Target date: 04/10/2023   Patient will  report pain at worst rated as </= 3/10 to reduce current functional limitations.  Baseline: see above Goal status: INITIAL   2.  Patient will demonstrate 5/5 middle trap strength to improve postural stability.  Baseline: see above Goal status: INITIAL   3.  Patient will improve trunk flexion AROM by at least 25% to improve bending mechanics.  Baseline: see above Goal status: INITIAL   4.  Patient will be independent with advanced home program to progress/maintain current level of function.  Baseline: see above Goal status: INITIAL   5.  Patient will score at least 66% on FOTO to signify clinically meaningful improvement in functional abilities.  Baseline: see above Goal status: new      PLAN: PT FREQUENCY: 1-2x/week   PT DURATION: 6 weeks   PLANNED INTERVENTIONS: Therapeutic exercises, Therapeutic activity, Neuromuscular re-education, Balance training, Patient/Family education, Self Care, Joint mobilization, Dry Needling, Electrical stimulation, Spinal manipulation, Spinal mobilization, Cryotherapy, Moist heat, Taping, Manual therapy, and Re-evaluation.   PLAN FOR NEXT SESSION:  review and progress HEP prn; trunk mobility all planes; TPDN response, hamstring stretching, postural strengthening     Hilda Blades, PT, DPT, LAT, ATC 03/28/23  11:23 AM  Phone: [phone] Fax: [phone]

## 2023-03-28 ENCOUNTER — Encounter: Payer: Self-pay | Admitting: Physical Therapy

## 2023-03-28 ENCOUNTER — Other Ambulatory Visit: Payer: Self-pay

## 2023-03-28 ENCOUNTER — Ambulatory Visit: Payer: Medicare Other | Attending: Internal Medicine | Admitting: Physical Therapy

## 2023-03-28 DIAGNOSIS — M6281 Muscle weakness (generalized): Secondary | ICD-10-CM

## 2023-03-28 DIAGNOSIS — M9908 Segmental and somatic dysfunction of rib cage: Secondary | ICD-10-CM | POA: Diagnosis present

## 2023-03-28 DIAGNOSIS — R29898 Other symptoms and signs involving the musculoskeletal system: Secondary | ICD-10-CM

## 2023-03-28 DIAGNOSIS — R293 Abnormal posture: Secondary | ICD-10-CM

## 2023-03-28 NOTE — Addendum Note (Signed)
Addended by: Karle Barr on: 03/28/2023 11:38 AM   Modules accepted: Level of Service

## 2023-03-30 ENCOUNTER — Other Ambulatory Visit: Payer: Self-pay

## 2023-03-30 ENCOUNTER — Encounter: Payer: Self-pay | Admitting: Physical Therapy

## 2023-03-30 ENCOUNTER — Ambulatory Visit: Payer: Medicare Other | Admitting: Physical Therapy

## 2023-03-30 DIAGNOSIS — M6281 Muscle weakness (generalized): Secondary | ICD-10-CM

## 2023-03-30 DIAGNOSIS — M9908 Segmental and somatic dysfunction of rib cage: Secondary | ICD-10-CM

## 2023-03-30 DIAGNOSIS — R293 Abnormal posture: Secondary | ICD-10-CM

## 2023-03-30 DIAGNOSIS — R29898 Other symptoms and signs involving the musculoskeletal system: Secondary | ICD-10-CM

## 2023-03-30 NOTE — Therapy (Signed)
OUTPATIENT PHYSICAL THERAPY TREATMENT NOTE   Patient Name: Steven Sanford MRN: [id number] DOB:19-Jun-1956, 67 y.o., male Today's Date: 03/30/2023  PCP: Janith Lima, MD   REFERRING PROVIDER: Janith Lima, MD     END OF SESSION:   PT End of Session - 03/30/23 0851     Visit Number 7    Number of Visits 13    Date for PT Re-Evaluation 04/15/23    Authorization Type MCR    Progress Note Due on Visit 10    PT Start Time 0848    PT Stop Time 0930    PT Time Calculation (min) 42 min    Activity Tolerance Patient tolerated treatment well    Behavior During Therapy [facility] for tasks assessed/performed                  Past Medical History:  Diagnosis Date   GERD (gastroesophageal reflux disease)    Hypertension    Past Surgical History:  Procedure Laterality Date   EYE SURGERY Left 2022   5 operations in total   Patient Active Problem List   Diagnosis Date Noted   Rib cage region somatic dysfunction 02/09/2023   PSA elevation 02/07/2023   Rising PSA level 02/07/2023   Encounter for general adult medical examination with abnormal findings 06/01/2022   Hyperlipidemia LDL goal <100 06/01/2022   Gastroesophageal reflux disease without esophagitis 06/01/2022   Primary hypertension 06/01/2022   Benign prostatic hyperplasia with urinary hesitancy 06/01/2022   Class 1 obesity due to excess calories with serious comorbidity and body mass index (BMI) of 31.0 to 31.9 in adult 06/01/2022   Peripheral sensory neuropathy 06/01/2022   Polyp of colon 06/01/2022   Erectile dysfunction due to arterial insufficiency 06/01/2022   Pure hypercholesterolemia, unspecified 04/17/2015    REFERRING DIAG: : M99.08 (ICD-10-CM) - Rib cage region somatic dysfunction    THERAPY DIAG:  Rib cage region somatic dysfunction  Other symptoms and signs involving the musculoskeletal system  Abnormal posture  Muscle weakness (generalized)  Rationale for Evaluation and Treatment  Rehabilitation  PERTINENT HISTORY: Hypertension, GERD  PRECAUTIONS: None    SUBJECTIVE:                                                                                                                                                                                     SUBJECTIVE STATEMENT:  Patient reports he is really feeling the spot around the left shoulder blade area and having numbness into the left arm mainly below the elbow and strongest on the inside of the forearm and in the pinky and ring finger. It seems to get worse at night and he wakes  up and it is throbbing and the arm is numb.   PAIN:  Are you having pain? Yes:  NPRS scale: 3/10 Pain location: Left shoulder blade and thoracic / neck region Pain description: Sore, throbbing Aggravating factors: Hot shower; laying on Left side Relieving factors: Cold   OBJECTIVE: (objective measures completed at initial evaluation unless otherwise dated) PATIENT SURVEYS:  03/01/23: FOTO: 51% function to 66% predicted   03/30/2023: 72% (met goal)   MUSCLE LENGTH: Hamstrings: Right lacking 55 deg; Left lacking 55 deg    POSTURE:  Decreased lumbar lordosis and decreased thoracic kyphosis   PALPATION: Hypomobility throughout T-spine and L-spine Tautness and palpable tenderness bilateral thoracic paraspinals  No palpable tenderness with rib or sternal palpation    LUMBAR ROM:    AROM eval  Flexion 75% limited  Extension 50% limited  Right lateral flexion 25% limited  Left lateral flexion 25% limited  Right rotation 25% limited   Left rotation 25% limited pain   (Blank rows = not tested)   LOWER EXTREMITY ROM:      Active  Right eval Left eval  Hip flexion      Hip extension      Hip abduction      Hip adduction      Hip internal rotation      Hip external rotation      Knee flexion      Knee extension      Ankle dorsiflexion      Ankle plantarflexion      Ankle inversion      Ankle eversion       (Blank rows = not  tested)   MMT:     MMT Right eval Left eval Rt / Lt 03/28/2023  Middle Trap  4 4 4  / 4  Hip extension 4 4   Hip abduction       Hip adduction       Hip internal rotation       Hip external rotation       Knee flexion       Knee extension       Ankle dorsiflexion       Ankle plantarflexion       Ankle inversion       Ankle eversion        (Blank rows = not tested)   GAIT: Distance walked: 10 ft  Assistive device utilized: None Level of assistance: Complete Independence Comments: WNL   TODAY'S TREATMENT [facility] Adult PT Treatment:                                                DATE: 03/30/2023 Therapeutic Exercise: UBE L3 x 4 min (fwd/bwd) while taking subjective Doorway pec stretch at 90 deg 3 x 20 sec Seated thoracic extension x 10 Seated double ER and scap retraction with blue 2 x 10 Row with FM 17# 2 x 10 Chin tuck at wall 10 x 5 sec hold Manual Therapy: Skilled palpation and monitoring of muscle tension while performing TPDN Cervical PA and lateral mobs STM / TPR left neck and shoulder region Trigger Point Dry Needling Treatment: Pre-treatment instruction: Patient instructed on dry needling rationale, procedures, and possible side effects including pain during treatment (achy,cramping feeling), bruising, drop of blood, lightheadedness, nausea, sweating. Patient Consent Given: Yes Education handout provided: Yes Muscles treated: left  upper trap, rhomboids/mid trap region, infraspinatus Needle size and number: .30x92mm x 6 Electrical stimulation performed: No Parameters: N/A Treatment response/outcome: Twitch response elicited and Palpable decrease in muscle tension Post-treatment instructions: Patient instructed to expect possible mild to moderate muscle soreness later today and/or tomorrow. Patient instructed in methods to reduce muscle soreness and to continue prescribed HEP. If patient was dry needled over the lung field, patient was instructed on signs and symptoms  of pneumothorax and, however unlikely, to see immediate medical attention should they occur. Patient was also educated on signs and symptoms of infection and to seek medical attention should they occur. Patient verbalized understanding of these instructions and education.   [facility] Adult PT Treatment:                                                DATE: 03/28/2023 Therapeutic Exercise: UBE L3 x 2 min bwd while taking subjective Supine thoracic extension over FR x 10 Sidelying thoracic rotation x 10 each Doorway pec stretch at 90 deg 3 x 20 sec Seated thoracic extension x 10 Seated double ER and scap retraction with blue 2 x 10 Row with FM 17# 2 x 10 Chin tuck at wall 10 x 5 sec hold Manual Therapy: Skilled palpation and monitoring of muscle tension while performing TPDN STM / TPR left neck and shoulder region Trigger Point Dry Needling Treatment: Pre-treatment instruction: Patient instructed on dry needling rationale, procedures, and possible side effects including pain during treatment (achy,cramping feeling), bruising, drop of blood, lightheadedness, nausea, sweating. Patient Consent Given: Yes Education handout provided: Yes Muscles treated: left rhomboids/mid trap region, infraspinatus Needle size and number: .30x22mm x 6 Electrical stimulation performed: No Parameters: N/A Treatment response/outcome: Twitch response elicited and Palpable decrease in muscle tension Post-treatment instructions: Patient instructed to expect possible mild to moderate muscle soreness later today and/or tomorrow. Patient instructed in methods to reduce muscle soreness and to continue prescribed HEP. If patient was dry needled over the lung field, patient was instructed on signs and symptoms of pneumothorax and, however unlikely, to see immediate medical attention should they occur. Patient was also educated on signs and symptoms of infection and to seek medical attention should they occur. Patient verbalized  understanding of these instructions and education.  [facility] Adult PT Treatment:                                                DATE: 03/23/2023 Therapeutic Exercise: UBE L3 x 2 min bwd while taking subjective Sidelying thoracic rotation x 10 each Supine thoracic extension over FR x 10 Seated double ER and scap retraction with green Seated horizontal abduction with green x 10 Row with black x 10 Extension with green x 10 Doorway pec stretch at 90 deg 3 x 20 sec Chin tuck at wall 10 x 5 sec hold Manual Therapy: Skilled palpation and monitoring of muscle tension while performing TPDN STM / TPR left neck and shoulder region Trigger Point Dry Needling Treatment: Pre-treatment instruction: Patient instructed on dry needling rationale, procedures, and possible side effects including pain during treatment (achy,cramping feeling), bruising, drop of blood, lightheadedness, nausea, sweating. Patient Consent Given: Yes Education handout provided: Yes Muscles treated: Left upper trap, bilateral rhomboids/mid trap  region, infraspinatus Needle size and number: .30x40mm x 6 Electrical stimulation performed: No Parameters: N/A Treatment response/outcome: Twitch response elicited and Palpable decrease in muscle tension Post-treatment instructions: Patient instructed to expect possible mild to moderate muscle soreness later today and/or tomorrow. Patient instructed in methods to reduce muscle soreness and to continue prescribed HEP. If patient was dry needled over the lung field, patient was instructed on signs and symptoms of pneumothorax and, however unlikely, to see immediate medical attention should they occur. Patient was also educated on signs and symptoms of infection and to seek medical attention should they occur. Patient verbalized understanding of these instructions and education.   PATIENT EDUCATION:  Education details: HEP, TPDN, FOTO Person educated: Patient Education method: Explanation,  Demonstration, Tactile cues, Verbal cues Education comprehension: verbalized understanding, returned demonstration, verbal cues required, tactile cues required, and needs further education   HOME EXERCISE PROGRAM: Access Code: [email or web address]   ASSESSMENT: CLINICAL IMPRESSION: Patient tolerated therapy well with no adverse effects. Patient continues to report neck and thoracic pain and tightness with left arm numbness that seems to be in a C8 distribution along the medial forearm and 4-5th digits. He does report  great improvement regarding his initial left rib cage pain and achieved his FOTO goal. Therapy continues to focus on improving spinal mobility and progressing his postural control to reduce cervical and thoracic pain. Updated his HEP to progress postural strengthening. Patient would benefit from continued skilled PT to progress his mobility and strength in order to reduce pain and maximize functional mobility.    OBJECTIVE IMPAIRMENTS: decreased knowledge of condition, decreased ROM, decreased strength, hypomobility, increased fascial restrictions, impaired flexibility, improper body mechanics, postural dysfunction, and pain.    ACTIVITY LIMITATIONS: carrying, lifting, bending, and squatting   PARTICIPATION LIMITATIONS: meal prep, cleaning, laundry, shopping, community activity, and yard work   PERSONAL FACTORS: Age and Fitness are also affecting patient's functional outcome.     GOALS: Goals reviewed with patient? Yes   SHORT TERM GOALS: Target date: 03/20/2023   Patient will be independent and compliant with initial HEP.   Baseline: see above 03/23/2023: independent Goal status: MET   2.  Therapist will capture FOTO and set appropriate LTG.  Baseline: not set up at evaluation.  03/23/2023: captured Goal status: MET   3.  Patient will report pain free trunk AROM to improve his ability to complete reaching and bending activity.  Baseline: see above  03/23/2023: continues to report  pain Goal status: ONGOING   4.  Patient will improve hamstring flexibility by at least 10 degrees bilaterally to reduce stress on the back with bending activity.  Baseline: see above  03/23/2023: not assessed Goal status: DEFERRED   LONG TERM GOALS: Target date: 04/10/2023   Patient will report pain at worst rated as </= 3/10 to reduce current functional limitations.  Baseline: see above Goal status: INITIAL   2.  Patient will demonstrate 5/5 middle trap strength to improve postural stability.  Baseline: see above Goal status: INITIAL   3.  Patient will improve trunk flexion AROM by at least 25% to improve bending mechanics.  Baseline: see above Goal status: INITIAL   4.  Patient will be independent with advanced home program to progress/maintain current level of function.  Baseline: see above Goal status: INITIAL   5.  Patient will score at least 66% on FOTO to signify clinically meaningful improvement in functional abilities.  Baseline: see above 03/30/2023: 72% Goal status: MET     PLAN: PT  FREQUENCY: 1-2x/week   PT DURATION: 6 weeks   PLANNED INTERVENTIONS: Therapeutic exercises, Therapeutic activity, Neuromuscular re-education, Balance training, Patient/Family education, Self Care, Joint mobilization, Dry Needling, Electrical stimulation, Spinal manipulation, Spinal mobilization, Cryotherapy, Moist heat, Taping, Manual therapy, and Re-evaluation.   PLAN FOR NEXT SESSION:  review and progress HEP prn; trunk mobility all planes; TPDN response, hamstring stretching, postural strengthening     Hilda Blades, PT, DPT, LAT, ATC 03/30/23  9:37 AM Phone: [phone] Fax: [phone]

## 2023-03-30 NOTE — Patient Instructions (Signed)
Access Code: [email or web address] URL: [email or web address] Date: 03/30/2023 Prepared by: Hilda Blades  Exercises - Supine Lower Trunk Rotation  - 1-2 x daily - 7 x weekly - 1 sets - 10 reps - Sidelying Thoracic Rotation with Open Book  - 1-2 x daily - 7 x weekly - 1 sets - 10 reps - Supine Diaphragmatic Breathing  - 1-2 x daily - 7 x weekly - 1 sets - 10 reps - Seated Hamstring Stretch  - 1-2 x daily - 7 x weekly - 3 sets - 30 sec  hold - Doorway Pec Stretch at 90 Degrees Abduction  - 1-2 x daily - 7 x weekly - 3 sets - 30 sec  hold - Thoracic Mobilization on Foam Roll - Hands Clasped  - 1-2 x daily - 7 x weekly - 10 reps - Cervical Retraction at Wall  - 2 x daily - 10 reps - 5 seconds hold - Shoulder External Rotation and Scapular Retraction with Resistance  - 1 x daily - 2 sets - 10 reps

## 2023-04-04 ENCOUNTER — Ambulatory Visit: Payer: Medicare Other

## 2023-04-04 DIAGNOSIS — M9908 Segmental and somatic dysfunction of rib cage: Secondary | ICD-10-CM

## 2023-04-04 DIAGNOSIS — R29898 Other symptoms and signs involving the musculoskeletal system: Secondary | ICD-10-CM

## 2023-04-04 DIAGNOSIS — R293 Abnormal posture: Secondary | ICD-10-CM

## 2023-04-04 DIAGNOSIS — M6281 Muscle weakness (generalized): Secondary | ICD-10-CM

## 2023-04-04 NOTE — Therapy (Signed)
OUTPATIENT PHYSICAL THERAPY TREATMENT NOTE   Patient Name: Steven Sanford MRN: [id number] DOB:07-25-1956, 67 y.o., male Today's Date: 04/04/2023  PCP: Etta GrandchildJones, Thomas L, MD   REFERRING PROVIDER: Etta GrandchildJones, Thomas L, MD     END OF SESSION:   PT End of Session - 04/04/23 0850     Visit Number 8    Number of Visits 13    Date for PT Re-Evaluation 04/15/23    Authorization Type MCR    Progress Note Due on Visit 10    PT Start Time 0850    PT Stop Time 0930    PT Time Calculation (min) 40 min    Activity Tolerance Patient tolerated treatment well    Behavior During Therapy [facility] for tasks assessed/performed                   Past Medical History:  Diagnosis Date   GERD (gastroesophageal reflux disease)    Hypertension    Past Surgical History:  Procedure Laterality Date   EYE SURGERY Left 2022   5 operations in total   Patient Active Problem List   Diagnosis Date Noted   Rib cage region somatic dysfunction 02/09/2023   PSA elevation 02/07/2023   Rising PSA level 02/07/2023   Encounter for general adult medical examination with abnormal findings 06/01/2022   Hyperlipidemia LDL goal <100 06/01/2022   Gastroesophageal reflux disease without esophagitis 06/01/2022   Primary hypertension 06/01/2022   Benign prostatic hyperplasia with urinary hesitancy 06/01/2022   Class 1 obesity due to excess calories with serious comorbidity and body mass index (BMI) of 31.0 to 31.9 in adult 06/01/2022   Peripheral sensory neuropathy 06/01/2022   Polyp of colon 06/01/2022   Erectile dysfunction due to arterial insufficiency 06/01/2022   Pure hypercholesterolemia, unspecified 04/17/2015    REFERRING DIAG: : M99.08 (ICD-10-CM) - Rib cage region somatic dysfunction    THERAPY DIAG:  Rib cage region somatic dysfunction  Other symptoms and signs involving the musculoskeletal system  Abnormal posture  Muscle weakness (generalized)  Rationale for Evaluation and Treatment  Rehabilitation  PERTINENT HISTORY: Hypertension, GERD  PRECAUTIONS: None    SUBJECTIVE:                                                                                                                                                                                     SUBJECTIVE STATEMENT:  Patient reports he has not been experiencing rib pain. He did some weight-lifting after last session and felt like he pulled something in his back, but this is better now. Chief complaint is the tingling in the LUE and neck pain.   PAIN:  Are you having pain?  Yes:  NPRS scale: 2/10 Pain location: Left shoulder blade and thoracic / neck region Pain description: Sore, throbbing Aggravating factors: Hot shower; laying on Left side Relieving factors: Cold   OBJECTIVE: (objective measures completed at initial evaluation unless otherwise dated) PATIENT SURVEYS:  03/01/23: FOTO: 51% function to 66% predicted   03/30/2023: 72% (met goal)   MUSCLE LENGTH: Hamstrings: Right lacking 55 deg; Left lacking 55 deg 04/04/23: Right lacking 45 deg; Left lacking 35 deg     POSTURE:  Decreased lumbar lordosis and decreased thoracic kyphosis   PALPATION: Hypomobility throughout T-spine and L-spine Tautness and palpable tenderness bilateral thoracic paraspinals  No palpable tenderness with rib or sternal palpation    LUMBAR ROM:    AROM eval 04/04/23  Flexion 75% limited 25% limited   Extension 50% limited 50% limited   Right lateral flexion 25% limited 25% limited pn  Left lateral flexion 25% limited 25% limited   Right rotation 25% limited  WNL  Left rotation 25% limited pain WNL   (Blank rows = not tested)   LOWER EXTREMITY ROM:      Active  Right eval Left eval  Hip flexion      Hip extension      Hip abduction      Hip adduction      Hip internal rotation      Hip external rotation      Knee flexion      Knee extension      Ankle dorsiflexion      Ankle plantarflexion      Ankle inversion       Ankle eversion       (Blank rows = not tested)   MMT:     MMT Right eval Left eval Rt / Lt 03/28/2023  Middle Trap  4 4 4  / 4  Hip extension 4 4   Hip abduction       Hip adduction       Hip internal rotation       Hip external rotation       Knee flexion       Knee extension       Ankle dorsiflexion       Ankle plantarflexion       Ankle inversion       Ankle eversion        (Blank rows = not tested)   GAIT: Distance walked: 10 ft  Assistive device utilized: None Level of assistance: Complete Independence Comments: WNL   TODAY'S TREATMENT [facility] Adult PT Treatment:                                                DATE: 04/04/23 Therapeutic Exercise: Seated cervical retraction x 10  Cat/cow x 10  Resisted rows 2 x 10 @ 40 lbs  Lat pull down 2 x 10 @ 40 lbs  Serratus punch 2 x 10 with stability ball  Updated HEP  Manual Therapy: Manual cervical distraction 3 x 1 minute     [facility] Adult PT Treatment:                                                DATE: 03/30/2023 Therapeutic Exercise: UBE L3  x 4 min (fwd/bwd) while taking subjective Doorway pec stretch at 90 deg 3 x 20 sec Seated thoracic extension x 10 Seated double ER and scap retraction with blue 2 x 10 Row with FM 17# 2 x 10 Chin tuck at wall 10 x 5 sec hold Manual Therapy: Skilled palpation and monitoring of muscle tension while performing TPDN Cervical PA and lateral mobs STM / TPR left neck and shoulder region Trigger Point Dry Needling Treatment: Pre-treatment instruction: Patient instructed on dry needling rationale, procedures, and possible side effects including pain during treatment (achy,cramping feeling), bruising, drop of blood, lightheadedness, nausea, sweating. Patient Consent Given: Yes Education handout provided: Yes Muscles treated: left upper trap, rhomboids/mid trap region, infraspinatus Needle size and number: .30x33mm x 6 Electrical stimulation performed: No Parameters: N/A Treatment  response/outcome: Twitch response elicited and Palpable decrease in muscle tension Post-treatment instructions: Patient instructed to expect possible mild to moderate muscle soreness later today and/or tomorrow. Patient instructed in methods to reduce muscle soreness and to continue prescribed HEP. If patient was dry needled over the lung field, patient was instructed on signs and symptoms of pneumothorax and, however unlikely, to see immediate medical attention should they occur. Patient was also educated on signs and symptoms of infection and to seek medical attention should they occur. Patient verbalized understanding of these instructions and education.   [facility] Adult PT Treatment:                                                DATE: 03/28/2023 Therapeutic Exercise: UBE L3 x 2 min bwd while taking subjective Supine thoracic extension over FR x 10 Sidelying thoracic rotation x 10 each Doorway pec stretch at 90 deg 3 x 20 sec Seated thoracic extension x 10 Seated double ER and scap retraction with blue 2 x 10 Row with FM 17# 2 x 10 Chin tuck at wall 10 x 5 sec hold Manual Therapy: Skilled palpation and monitoring of muscle tension while performing TPDN STM / TPR left neck and shoulder region Trigger Point Dry Needling Treatment: Pre-treatment instruction: Patient instructed on dry needling rationale, procedures, and possible side effects including pain during treatment (achy,cramping feeling), bruising, drop of blood, lightheadedness, nausea, sweating. Patient Consent Given: Yes Education handout provided: Yes Muscles treated: left rhomboids/mid trap region, infraspinatus Needle size and number: .30x33mm x 6 Electrical stimulation performed: No Parameters: N/A Treatment response/outcome: Twitch response elicited and Palpable decrease in muscle tension Post-treatment instructions: Patient instructed to expect possible mild to moderate muscle soreness later today and/or tomorrow. Patient  instructed in methods to reduce muscle soreness and to continue prescribed HEP. If patient was dry needled over the lung field, patient was instructed on signs and symptoms of pneumothorax and, however unlikely, to see immediate medical attention should they occur. Patient was also educated on signs and symptoms of infection and to seek medical attention should they occur. Patient verbalized understanding of these instructions and education.    PATIENT EDUCATION:  Education details: HEP Person educated: Patient Education method: Solicitor, Actor cues, Verbal cues, handout  Education comprehension: verbalized understanding, returned demonstration, verbal cues required, tactile cues required, and needs further education   HOME EXERCISE PROGRAM: Access Code: [email or web address]   ASSESSMENT: CLINICAL IMPRESSION: Patient tolerated therapy well with no adverse effects. Patient continues to report neck and thoracic pain and tightness with left arm numbness  that is reduced with manual cervical distraction today. Focused on postural strengthening utilizing machines with good tolerance. Occasional postural cues required to reduce shoulder shrug. Patient reported improvement in pain at conclusion of session.   OBJECTIVE IMPAIRMENTS: decreased knowledge of condition, decreased ROM, decreased strength, hypomobility, increased fascial restrictions, impaired flexibility, improper body mechanics, postural dysfunction, and pain.    ACTIVITY LIMITATIONS: carrying, lifting, bending, and squatting   PARTICIPATION LIMITATIONS: meal prep, cleaning, laundry, shopping, community activity, and yard work   PERSONAL FACTORS: Age and Fitness are also affecting patient's functional outcome.     GOALS: Goals reviewed with patient? Yes   SHORT TERM GOALS: Target date: 03/20/2023   Patient will be independent and compliant with initial HEP.   Baseline: see above 03/23/2023: independent Goal status: MET    2.  Therapist will capture FOTO and set appropriate LTG.  Baseline: not set up at evaluation.  03/23/2023: captured Goal status: MET   3.  Patient will report pain free trunk AROM to improve his ability to complete reaching and bending activity.  Baseline: see above  03/23/2023: continues to report pain Goal status: nearly met    4.  Patient will improve hamstring flexibility by at least 10 degrees bilaterally to reduce stress on the back with bending activity.  Baseline: see above  03/23/2023: not assessed Goal status: met   LONG TERM GOALS: Target date: 04/10/2023   Patient will report pain at worst rated as </= 3/10 to reduce current functional limitations.  Baseline: see above Goal status: INITIAL   2.  Patient will demonstrate 5/5 middle trap strength to improve postural stability.  Baseline: see above Goal status: INITIAL   3.  Patient will improve trunk flexion AROM by at least 25% to improve bending mechanics.  Baseline: see above Goal status: INITIAL   4.  Patient will be independent with advanced home program to progress/maintain current level of function.  Baseline: see above Goal status: INITIAL   5.  Patient will score at least 66% on FOTO to signify clinically meaningful improvement in functional abilities.  Baseline: see above 03/30/2023: 72% Goal status: MET     PLAN: PT FREQUENCY: 1-2x/week   PT DURATION: 6 weeks   PLANNED INTERVENTIONS: Therapeutic exercises, Therapeutic activity, Neuromuscular re-education, Balance training, Patient/Family education, Self Care, Joint mobilization, Dry Needling, Electrical stimulation, Spinal manipulation, Spinal mobilization, Cryotherapy, Moist heat, Taping, Manual therapy, and Re-evaluation.   PLAN FOR NEXT SESSION:  review and progress HEP prn; trunk mobility all planes; TPDN response, hamstring stretching, postural strengthening    Letitia Libra, PT, DPT, ATC 04/04/23 9:35 AM

## 2023-04-06 ENCOUNTER — Ambulatory Visit: Payer: Medicare Other | Admitting: Physical Therapy

## 2023-04-06 ENCOUNTER — Encounter: Payer: Self-pay | Admitting: Physical Therapy

## 2023-04-06 DIAGNOSIS — M9908 Segmental and somatic dysfunction of rib cage: Secondary | ICD-10-CM | POA: Diagnosis not present

## 2023-04-06 DIAGNOSIS — R29898 Other symptoms and signs involving the musculoskeletal system: Secondary | ICD-10-CM

## 2023-04-06 DIAGNOSIS — R293 Abnormal posture: Secondary | ICD-10-CM

## 2023-04-06 DIAGNOSIS — M6281 Muscle weakness (generalized): Secondary | ICD-10-CM

## 2023-04-06 NOTE — Therapy (Signed)
OUTPATIENT PHYSICAL THERAPY TREATMENT NOTE   Patient Name: Steven ParasCharles Ozment MRN: [id number] DOB:[birth year], 67 y.o., male Today's Date: 04/06/2023  PCP: Etta GrandchildJones, Thomas L, MD   REFERRING PROVIDER: Etta GrandchildJones, Thomas L, MD     END OF SESSION:   PT End of Session - 04/06/23 0905     Visit Number 9    Number of Visits 13    Date for PT Re-Evaluation 04/15/23    Authorization Type MCR    Progress Note Due on Visit 10    PT Start Time 0903    PT Stop Time 0930    PT Time Calculation (min) 27 min    Activity Tolerance Patient tolerated treatment well    Behavior [facility] tasks assessed/performed                    Past Medical History:  Diagnosis Date   GERD (gastroesophageal reflux disease)    Hypertension    Past Surgical History:  Procedure Laterality Date   EYE SURGERY Left 2022   5 operations in total   Patient Active Problem List   Diagnosis Date Noted   Rib cage region somatic dysfunction 02/09/2023   PSA elevation 02/07/2023   Rising PSA level 02/07/2023   Encounter for general adult medical examination with abnormal findings 06/01/2022   Hyperlipidemia LDL goal <100 06/01/2022   Gastroesophageal reflux disease without esophagitis 06/01/2022   Primary hypertension 06/01/2022   Benign prostatic hyperplasia with urinary hesitancy 06/01/2022   Class 1 obesity due to excess calories with serious comorbidity and body mass index (BMI) of 31.0 to 31.9 in adult 06/01/2022   Peripheral sensory neuropathy 06/01/2022   Polyp of colon 06/01/2022   Erectile dysfunction due to arterial insufficiency 06/01/2022   Pure hypercholesterolemia, unspecified 04/17/2015    REFERRING DIAG: : M99.08 (ICD-10-CM) - Rib cage region somatic dysfunction    THERAPY DIAG:  Rib cage region somatic dysfunction  Other symptoms and signs involving the musculoskeletal system  Abnormal posture  Muscle weakness (generalized)  Rationale for Evaluation and Treatment  Rehabilitation  PERTINENT HISTORY: Hypertension, GERD  PRECAUTIONS: None    SUBJECTIVE:                                                                                                                                                                                     SUBJECTIVE STATEMENT:  Patient reports he is 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   PAIN:  Are you having pain? Yes:  NPRS scale: 2/10 Pain location: Left shoulder blade and thoracic / neck region Pain description: Sore, throbbing Aggravating factors: Hot shower; laying on Left side Relieving factors:  Cold   OBJECTIVE: (objective measures completed at initial evaluation unless otherwise dated) PATIENT SURVEYS:  03/01/23: FOTO: 51% function to 66% predicted   03/30/2023: 72% (met goal)   MUSCLE LENGTH: Hamstrings: Right lacking 55 deg; Left lacking 55 deg 04/04/23: Right lacking 45 deg; Left lacking 35 deg     POSTURE:  Decreased lumbar lordosis and decreased thoracic kyphosis   PALPATION: Hypomobility throughout T-spine and L-spine Tautness and palpable tenderness bilateral thoracic paraspinals  No palpable tenderness with rib or sternal palpation    LUMBAR ROM:    AROM eval 04/04/23  Flexion 75% limited 25% limited   Extension 50% limited 50% limited   Right lateral flexion 25% limited 25% limited pn  Left lateral flexion 25% limited 25% limited   Right rotation 25% limited  WNL  Left rotation 25% limited pain WNL   (Blank rows = not tested)   LOWER EXTREMITY ROM:      Active  Right eval Left eval  Hip flexion      Hip extension      Hip abduction      Hip adduction      Hip internal rotation      Hip external rotation      Knee flexion      Knee extension      Ankle dorsiflexion      Ankle plantarflexion      Ankle inversion      Ankle eversion       (Blank rows = not tested)   MMT:     MMT Right eval Left eval Rt / Lt 03/28/2023  Middle Trap  4 4 4  / 4  Hip extension 4 4   Hip  abduction       Hip adduction       Hip internal rotation       Hip external rotation       Knee flexion       Knee extension       Ankle dorsiflexion       Ankle plantarflexion       Ankle inversion       Ankle eversion        (Blank rows = not tested)   GAIT: Distance walked: 10 ft  Assistive device utilized: None Level of assistance: Complete Independence Comments: WNL   TODAY'S TREATMENT [facility] Adult PT Treatment:                                                DATE: 04/06/2023 Therapeutic Exercise: UBE L3 x 4 min (fwd/bwd) while taking subjective Sidelying thoracic rotation x 10 Doorway pec stretch at 90 deg 3 x 20 sec Seated thoracic extension x 10 Seated chin tuck x 15 Seated double ER and scap retraction with green x 20 Seated horizontal abduction with green x 20 Row with black x 20   [facility] Adult PT Treatment:                                                DATE: 04/04/23 Therapeutic Exercise: Seated cervical retraction x 10  Cat/cow x 10  Resisted rows 2 x 10 @ 40 lbs  Lat pull down 2 x 10 @ 40  lbs  Serratus punch 2 x 10 with stability ball  Updated HEP  Manual Therapy: Manual cervical distraction 3 x 1 minute   [facility] Adult PT Treatment:                                                DATE: 03/30/2023 Therapeutic Exercise: UBE L3 x 4 min (fwd/bwd) while taking subjective Doorway pec stretch at 90 deg 3 x 20 sec Seated thoracic extension x 10 Seated double ER and scap retraction with blue 2 x 10 Row with FM 17# 2 x 10 Chin tuck at wall 10 x 5 sec hold Manual Therapy: Skilled palpation and monitoring of muscle tension while performing TPDN Cervical PA and lateral mobs STM / TPR left neck and shoulder region Trigger Point Dry Needling Treatment: Pre-treatment instruction: Patient instructed on dry needling rationale, procedures, and possible side effects including pain during treatment (achy,cramping feeling), bruising, drop of blood, lightheadedness, nausea,  sweating. Patient Consent Given: Yes Education handout provided: Yes Muscles treated: left upper trap, rhomboids/mid trap region, infraspinatus Needle size and number: .30x26mm x 6 Electrical stimulation performed: No Parameters: N/A Treatment response/outcome: Twitch response elicited and Palpable decrease in muscle tension Post-treatment instructions: Patient instructed to expect possible mild to moderate muscle soreness later today and/or tomorrow. Patient instructed in methods to reduce muscle soreness and to continue prescribed HEP. If patient was dry needled over the lung field, patient was instructed on signs and symptoms of pneumothorax and, however unlikely, to see immediate medical attention should they occur. Patient was also educated on signs and symptoms of infection and to seek medical attention should they occur. Patient verbalized understanding of these instructions and education.  PATIENT EDUCATION:  Education details: HEP Person educated: Patient Education method: Solicitor, Actor cues, Verbal cues Education comprehension: verbalized understanding, returned demonstration, verbal cues required, tactile cues required, and needs further education   HOME EXERCISE PROGRAM: Access Code: [email or web address]   ASSESSMENT: CLINICAL IMPRESSION: Patient tolerated therapy well with no adverse effects. Therapy continues to focus on progressing spinal mobility and postural strengthening with good tolerance. He continues to report left arm/hand numbness that seems to get exacerbated with exercise and activity. He does continue to exhibit postural deviations and limitations in mobility. No changes to HEP this visit. Patient would benefit from continued skilled PT to progress his mobility and strength in order to reduce pain and maximize functional mobility.    OBJECTIVE IMPAIRMENTS: decreased knowledge of condition, decreased ROM, decreased strength, hypomobility, increased fascial  restrictions, impaired flexibility, improper body mechanics, postural dysfunction, and pain.    ACTIVITY LIMITATIONS: carrying, lifting, bending, and squatting   PARTICIPATION LIMITATIONS: meal prep, cleaning, laundry, shopping, community activity, and yard work   PERSONAL FACTORS: Age and Fitness are also affecting patient's functional outcome.     GOALS: Goals reviewed with patient? Yes   SHORT TERM GOALS: Target date: 03/20/2023   Patient will be independent and compliant with initial HEP.   Baseline: see above 03/23/2023: independent Goal status: MET   2.  Therapist will capture FOTO and set appropriate LTG.  Baseline: not set up at evaluation.  03/23/2023: captured Goal status: MET   3.  Patient will report pain free trunk AROM to improve his ability to complete reaching and bending activity.  Baseline: see above  03/23/2023: continues to report pain Goal status: nearly met  4.  Patient will improve hamstring flexibility by at least 10 degrees bilaterally to reduce stress on the back with bending activity.  Baseline: see above  03/23/2023: not assessed Goal status: met   LONG TERM GOALS: Target date: 04/15/2023   Patient will report pain at worst rated as </= 3/10 to reduce current functional limitations.  Baseline: see above Goal status: INITIAL   2.  Patient will demonstrate 5/5 middle trap strength to improve postural stability.  Baseline: see above Goal status: INITIAL   3.  Patient will improve trunk flexion AROM by at least 25% to improve bending mechanics.  Baseline: see above Goal status: INITIAL   4.  Patient will be independent with advanced home program to progress/maintain current level of function.  Baseline: see above Goal status: INITIAL   5.  Patient will score at least 66% on FOTO to signify clinically meaningful improvement in functional abilities.  Baseline: see above 03/30/2023: 72% Goal status: MET     PLAN: PT FREQUENCY: 1-2x/week   PT  DURATION: 6 weeks   PLANNED INTERVENTIONS: Therapeutic exercises, Therapeutic activity, Neuromuscular re-education, Balance training, Patient/Family education, Self Care, Joint mobilization, Dry Needling, Electrical stimulation, Spinal manipulation, Spinal mobilization, Cryotherapy, Moist heat, Taping, Manual therapy, and Re-evaluation.   PLAN FOR NEXT SESSION:  review and progress HEP prn; trunk mobility all planes; TPDN response, hamstring stretching, postural strengthening     Rosana Hoes, PT, DPT, LAT, ATC 04/06/23  9:41 AM Phone: [phone] Fax: [phone]

## 2023-04-10 ENCOUNTER — Ambulatory Visit: Payer: Medicare Other | Admitting: Cardiology

## 2023-04-10 NOTE — Therapy (Signed)
OUTPATIENT PHYSICAL THERAPY TREATMENT NOTE   Progress Note Reporting Period 02/27/2023 to 04/11/2023  See note below for Objective Data and Assessment of Progress/Goals.       Patient Name: Steven Sanford MRN: [id number] DOB:11-10-1956, 67 y.o., male Today's Date: 04/11/2023  PCP: Etta Grandchild, MD   REFERRING PROVIDER: Etta Grandchild, MD     END OF SESSION:   PT End of Session - 04/11/23 0850     Visit Number 10    Number of Visits 13    Date for PT Re-Evaluation 04/15/23    Authorization Type MCR    Progress Note Due on Visit 10    PT Start Time 0845    PT Stop Time 0930    PT Time Calculation (min) 45 min    Activity Tolerance Patient tolerated treatment well    Behavior During Therapy [facility] for tasks assessed/performed                     Past Medical History:  Diagnosis Date   GERD (gastroesophageal reflux disease)    Hypertension    Past Surgical History:  Procedure Laterality Date   EYE SURGERY Left 2022   5 operations in total   Patient Active Problem List   Diagnosis Date Noted   Rib cage region somatic dysfunction 02/09/2023   PSA elevation 02/07/2023   Rising PSA level 02/07/2023   Encounter for general adult medical examination with abnormal findings 06/01/2022   Hyperlipidemia LDL goal <100 06/01/2022   Gastroesophageal reflux disease without esophagitis 06/01/2022   Primary hypertension 06/01/2022   Benign prostatic hyperplasia with urinary hesitancy 06/01/2022   Class 1 obesity due to excess calories with serious comorbidity and body mass index (BMI) of 31.0 to 31.9 in adult 06/01/2022   Peripheral sensory neuropathy 06/01/2022   Polyp of colon 06/01/2022   Erectile dysfunction due to arterial insufficiency 06/01/2022   Pure hypercholesterolemia, unspecified 04/17/2015    REFERRING DIAG: : M99.08 (ICD-10-CM) - Rib cage region somatic dysfunction    THERAPY DIAG:  Abnormal posture  Muscle weakness (generalized)  Other  symptoms and signs involving the musculoskeletal system  Rib cage region somatic dysfunction  Rationale for Evaluation and Treatment Rehabilitation  PERTINENT HISTORY: Hypertension, GERD  PRECAUTIONS: None    SUBJECTIVE:                                                                                                                                                                                     SUBJECTIVE STATEMENT:  Patient reports he is doing well today. He did have to spend a lot of time in the car over the weekend which  seemed to aggravate his neck and back some. He feels what aggravates his neck and back varies and changes.  PAIN:  Are you having pain? Yes:  NPRS scale: 2/10 Pain location: Left shoulder blade and thoracic / neck region Pain description: Sore, throbbing Aggravating factors: Hot shower; laying on Left side Relieving factors: Cold   OBJECTIVE: (objective measures completed at initial evaluation unless otherwise dated) PATIENT SURVEYS:  03/01/23: FOTO: 51% function to 66% predicted   03/30/2023: 72% (met goal)   MUSCLE LENGTH: Hamstrings: Right lacking 55 deg; Left lacking 55 deg 04/04/23: Right lacking 45 deg; Left lacking 35 deg     POSTURE:  Decreased lumbar lordosis and decreased thoracic kyphosis   PALPATION: Hypomobility throughout T-spine and L-spine Tautness and palpable tenderness bilateral thoracic paraspinals  No palpable tenderness with rib or sternal palpation    LUMBAR ROM:    AROM eval 04/04/23  Flexion 75% limited 25% limited   Extension 50% limited 50% limited   Right lateral flexion 25% limited 25% limited pn  Left lateral flexion 25% limited 25% limited   Right rotation 25% limited  WNL  Left rotation 25% limited pain WNL   (Blank rows = not tested)   LOWER EXTREMITY ROM:      Active  Right eval Left eval  Hip flexion      Hip extension      Hip abduction      Hip adduction      Hip internal rotation      Hip external  rotation      Knee flexion      Knee extension      Ankle dorsiflexion      Ankle plantarflexion      Ankle inversion      Ankle eversion       (Blank rows = not tested)   MMT:     MMT Right eval Left eval Rt / Lt 03/28/2023  Middle Trap  / 4  Hip extension 4 4   Hip abduction       Hip adduction       Hip internal rotation       Hip external rotation       Knee flexion       Knee extension       Ankle dorsiflexion       Ankle plantarflexion       Ankle inversion       Ankle eversion        (Blank rows = not tested)   GAIT: Distance walked: 10 ft  Assistive device utilized: None Level of assistance: Complete Independence Comments: WNL   TODAY'S TREATMENT [facility] Adult PT Treatment:                                                DATE: 04/11/2023 Therapeutic Exercise: UBE L3 x 4 min (fwd/bwd) while taking subjective Sidelying thoracic rotation x 10 Doorway pec stretch at 90 deg 3 x 20 sec Seated thoracic extension x 10 Seated chin tuck 10 x 5 sec Seated hamstring stretch 2 x 30 sec each Row with blue 2 x 20 Seated double ER and scap retraction with green x 20 Seated horizontal abduction with green x 20 Manual Therapy: Skilled palpation and monitoring of muscle tension while performing TPDN STM / TPR left neck and  shoulder region Trigger Point Dry Needling Treatment: Pre-treatment instruction: Patient instructed on dry needling rationale, procedures, and possible side effects including pain during treatment (achy,cramping feeling), bruising, drop of blood, lightheadedness, nausea, sweating. Patient Consent Given: Yes Education handout provided: Yes Muscles treated: left upper trap, rhomboids/mid trap region, infraspinatus, posterior deltoid Needle size and number: .30x26mm x 7 Electrical stimulation performed: No Parameters: N/A Treatment response/outcome: Twitch response elicited and Palpable decrease in muscle tension Post-treatment instructions: Patient  instructed to expect possible mild to moderate muscle soreness later today and/or tomorrow. Patient instructed in methods to reduce muscle soreness and to continue prescribed HEP. If patient was dry needled over the lung field, patient was instructed on signs and symptoms of pneumothorax and, however unlikely, to see immediate medical attention should they occur. Patient was also educated on signs and symptoms of infection and to seek medical attention should they occur. Patient verbalized understanding of these instructions and education.   [facility] Adult PT Treatment:                                                DATE: 04/06/2023 Therapeutic Exercise: UBE L3 x 4 min (fwd/bwd) while taking subjective Sidelying thoracic rotation x 10 Doorway pec stretch at 90 deg 3 x 20 sec Seated thoracic extension x 10 Seated chin tuck x 15 Seated double ER and scap retraction with green x 20 Seated horizontal abduction with green x 20 Row with black x 20  [facility] Adult PT Treatment:                                                DATE: 04/04/23 Therapeutic Exercise: Seated cervical retraction x 10  Cat/cow x 10  Resisted rows 2 x 10 @ 40 lbs  Lat pull down 2 x 10 @ 40 lbs  Serratus punch 2 x 10 with stability ball  Updated HEP  Manual Therapy: Manual cervical distraction 3 x 1 minute   [facility] Adult PT Treatment:                                                DATE: 03/30/2023 Therapeutic Exercise: UBE L3 x 4 min (fwd/bwd) while taking subjective Doorway pec stretch at 90 deg 3 x 20 sec Seated thoracic extension x 10 Seated double ER and scap retraction with blue 2 x 10 Row with FM 17# 2 x 10 Chin tuck at wall 10 x 5 sec hold Manual Therapy: Skilled palpation and monitoring of muscle tension while performing TPDN Cervical PA and lateral mobs STM / TPR left neck and shoulder region Trigger Point Dry Needling Treatment: Pre-treatment instruction: Patient instructed on dry needling rationale, procedures, and  possible side effects including pain during treatment (achy,cramping feeling), bruising, drop of blood, lightheadedness, nausea, sweating. Patient Consent Given: Yes Education handout provided: Yes Muscles treated: left upper trap, rhomboids/mid trap region, infraspinatus Needle size and number: .30x32mm x 6 Electrical stimulation performed: No Parameters: N/A Treatment response/outcome: Twitch response elicited and Palpable decrease in muscle tension Post-treatment instructions: Patient instructed to expect possible mild to moderate muscle soreness later today  and/or tomorrow. Patient instructed in methods to reduce muscle soreness and to continue prescribed HEP. If patient was dry needled over the lung field, patient was instructed on signs and symptoms of pneumothorax and, however unlikely, to see immediate medical attention should they occur. Patient was also educated on signs and symptoms of infection and to seek medical attention should they occur. Patient verbalized understanding of these instructions and education.  PATIENT EDUCATION:  Education details: HEP Person educated: Patient Education method: Solicitor, Actor cues, Verbal cues Education comprehension: verbalized understanding, returned demonstration, verbal cues required, tactile cues required, and needs further education   HOME EXERCISE PROGRAM: Access Code: [email or web address]   ASSESSMENT: CLINICAL IMPRESSION: Patient tolerated therapy well with no adverse effects. Therapy continued with TPDN followed by mobility and postural control exercises with good tolerance. Multiple twitch responses on left neck and posterior shoulder region. He does report feeling looser overall. He does continue to exhibit generalized postural deficits and stiffness. No changes made to his HEP this visit. Patient would benefit from continued skilled PT to    OBJECTIVE IMPAIRMENTS: decreased knowledge of condition, decreased ROM, decreased  strength, hypomobility, increased fascial restrictions, impaired flexibility, improper body mechanics, postural dysfunction, and pain.    ACTIVITY LIMITATIONS: carrying, lifting, bending, and squatting   PARTICIPATION LIMITATIONS: meal prep, cleaning, laundry, shopping, community activity, and yard work   PERSONAL FACTORS: Age and Fitness are also affecting patient's functional outcome.     GOALS: Goals reviewed with patient? Yes   SHORT TERM GOALS: Target date: 03/20/2023   Patient will be independent and compliant with initial HEP.   Baseline: see above 03/23/2023: independent Goal status: MET   2.  Therapist will capture FOTO and set appropriate LTG.  Baseline: not set up at evaluation.  03/23/2023: captured Goal status: MET   3.  Patient will report pain free trunk AROM to improve his ability to complete reaching and bending activity.  Baseline: see above  03/23/2023: continues to report pain Goal status: nearly met    4.  Patient will improve hamstring flexibility by at least 10 degrees bilaterally to reduce stress on the back with bending activity.  Baseline: see above  03/23/2023: not assessed Goal status: met   LONG TERM GOALS: Target date: 04/15/2023   Patient will report pain at worst rated as </= 3/10 to reduce current functional limitations.  Baseline: see above Goal status: INITIAL   2.  Patient will demonstrate 5/5 middle trap strength to improve postural stability.  Baseline: see above Goal status: INITIAL   3.  Patient will improve trunk flexion AROM by at least 25% to improve bending mechanics.  Baseline: see above Goal status: INITIAL   4.  Patient will be independent with advanced home program to progress/maintain current level of function.  Baseline: see above Goal status: INITIAL   5.  Patient will score at least 66% on FOTO to signify clinically meaningful improvement in functional abilities.  Baseline: see above 03/30/2023: 72% Goal status: MET      PLAN: PT FREQUENCY: 1-2x/week   PT DURATION: 6 weeks   PLANNED INTERVENTIONS: Therapeutic exercises, Therapeutic activity, Neuromuscular re-education, Balance training, Patient/Family education, Self Care, Joint mobilization, Dry Needling, Electrical stimulation, Spinal manipulation, Spinal mobilization, Cryotherapy, Moist heat, Taping, Manual therapy, and Re-evaluation.   PLAN FOR NEXT SESSION:  review and progress HEP prn; trunk mobility all planes; TPDN response, hamstring stretching, postural strengthening     Rosana Hoes, PT, DPT, LAT, ATC 04/11/23  9:28 AM Phone: [phone]  Fax: [phone]

## 2023-04-11 ENCOUNTER — Other Ambulatory Visit: Payer: Self-pay

## 2023-04-11 ENCOUNTER — Ambulatory Visit: Payer: Medicare Other | Admitting: Physical Therapy

## 2023-04-11 ENCOUNTER — Encounter: Payer: Self-pay | Admitting: Physical Therapy

## 2023-04-11 DIAGNOSIS — M6281 Muscle weakness (generalized): Secondary | ICD-10-CM

## 2023-04-11 DIAGNOSIS — M9908 Segmental and somatic dysfunction of rib cage: Secondary | ICD-10-CM

## 2023-04-11 DIAGNOSIS — R293 Abnormal posture: Secondary | ICD-10-CM

## 2023-04-11 DIAGNOSIS — R29898 Other symptoms and signs involving the musculoskeletal system: Secondary | ICD-10-CM

## 2023-04-12 NOTE — Therapy (Signed)
OUTPATIENT PHYSICAL THERAPY TREATMENT NOTE  DISCHARGE    Patient Name: Steven Sanford MRN: [id number] DOB:10-31-1956, 67 y.o., male Today's Date: 04/13/2023  PCP: Etta Grandchild, MD   REFERRING PROVIDER: Etta Grandchild, MD     END OF SESSION:   PT End of Session - 04/13/23 0941     Visit Number 11    Number of Visits 13    Date for PT Re-Evaluation 04/15/23    Authorization Type MCR    Progress Note Due on Visit 20    PT Start Time 0845    PT Stop Time 0930    PT Time Calculation (min) 45 min    Activity Tolerance Patient tolerated treatment well    Behavior During Therapy [facility] for tasks assessed/performed                      Past Medical History:  Diagnosis Date   GERD (gastroesophageal reflux disease)    Hypertension    Past Surgical History:  Procedure Laterality Date   EYE SURGERY Left 2022   5 operations in total   Patient Active Problem List   Diagnosis Date Noted   Rib cage region somatic dysfunction 02/09/2023   PSA elevation 02/07/2023   Rising PSA level 02/07/2023   Encounter for general adult medical examination with abnormal findings 06/01/2022   Hyperlipidemia LDL goal <100 06/01/2022   Gastroesophageal reflux disease without esophagitis 06/01/2022   Primary hypertension 06/01/2022   Benign prostatic hyperplasia with urinary hesitancy 06/01/2022   Class 1 obesity due to excess calories with serious comorbidity and body mass index (BMI) of 31.0 to 31.9 in adult 06/01/2022   Peripheral sensory neuropathy 06/01/2022   Polyp of colon 06/01/2022   Erectile dysfunction due to arterial insufficiency 06/01/2022   Pure hypercholesterolemia, unspecified 04/17/2015    REFERRING DIAG: Rib cage region somatic dysfunction    THERAPY DIAG:  Abnormal posture  Muscle weakness (generalized)  Other symptoms and signs involving the musculoskeletal system  Rib cage region somatic dysfunction  Rationale for Evaluation and Treatment  Rehabilitation  PERTINENT HISTORY: Hypertension, GERD  PRECAUTIONS: None    SUBJECTIVE:                                                                                                                                                                                     SUBJECTIVE STATEMENT:  Patient reports the left arm and around the shoulder pain continue to get more intense at certain times and at night, it seems to be staying the same. But other than that he seems to be feeling better and looser in general.  PAIN:  Are you  having pain? Yes:  NPRS scale: 3/10 Pain location: Left shoulder blade and thoracic / neck region Pain description: Sore, throbbing, numbness Aggravating factors: Activity, sleeping, lying on left side Relieving factors: Stretching, heat/ice   OBJECTIVE: (objective measures completed at initial evaluation unless otherwise dated) PATIENT SURVEYS:  03/01/23: FOTO: 51% function to 66% predicted   03/30/2023: 72% (met goal)   MUSCLE LENGTH: Hamstrings: Right lacking 55 deg; Left lacking 55 deg 04/04/23: Right lacking 45 deg; Left lacking 35 deg     POSTURE:  Decreased lumbar lordosis and decreased thoracic kyphosis   PALPATION: Hypomobility throughout T-spine and L-spine Tautness and palpable tenderness bilateral thoracic paraspinals  No palpable tenderness with rib or sternal palpation    LUMBAR ROM:    AROM eval 04/04/23 04/13/2023  Flexion 75% limited 25% limited  50% limited  Extension 50% limited 50% limited  50% limited  Right lateral flexion 25% limited 25% limited pn 25% limited  Left lateral flexion 25% limited 25% limited  25% limited  Right rotation 25% limited  WNL   Left rotation 25% limited pain WNL    (Blank rows = not tested)   LOWER EXTREMITY ROM:      Active  Right eval Left eval  Hip flexion      Hip extension      Hip abduction      Hip adduction      Hip internal rotation      Hip external rotation      Knee flexion      Knee  extension      Ankle dorsiflexion      Ankle plantarflexion      Ankle inversion      Ankle eversion       (Blank rows = not tested)   MMT:     MMT Right eval Left eval Rt / Lt 03/28/2023 Rt / Lt 04/13/2023  Middle Trap  4 4 4  / 4 4 / 4  Hip extension 4 4    Hip abduction        Hip adduction        Hip internal rotation        Hip external rotation        Knee flexion        Knee extension        Ankle dorsiflexion        Ankle plantarflexion        Ankle inversion        Ankle eversion         (Blank rows = not tested)   GAIT: Distance walked: 10 ft  Assistive device utilized: None Level of assistance: Complete Independence Comments: WNL   TODAY'S TREATMENT [facility] Adult PT Treatment:                                                DATE: 04/13/2023 Therapeutic Exercise: UBE L3 x 4 min (fwd/bwd) while taking subjective Sidelying thoracic rotation x 10 Sidelying sleeper and cross body stretch 3 x 20 sec each Supine thoracic extension over towel roll x 10 Seated hamstring stretch 3 x 20 sec each Doorway pec stretch at 90 deg 3 x 20 sec Seated chin tuck 10 x 5 sec Row with blue x 15 Extension with red x 15 Seated double ER and scap retraction with red x 15  Seated horizontal abduction with red x 15 Manual Therapy: Skilled palpation and monitoring of muscle tension while performing TPDN STM / TPR left neck and posterior shoulder region Trigger Point Dry Needling Treatment: Pre-treatment instruction: Patient instructed on dry needling rationale, procedures, and possible side effects including pain during treatment (achy,cramping feeling), bruising, drop of blood, lightheadedness, nausea, sweating. Patient Consent Given: Yes Education handout provided: Yes Muscles treated: left upper trap, infraspinatus, posterior deltoid Needle size and number: .30x59mm x 7 Electrical stimulation performed: No Parameters: N/A Treatment response/outcome: Twitch response elicited and  Palpable decrease in muscle tension Post-treatment instructions: Patient instructed to expect possible mild to moderate muscle soreness later today and/or tomorrow. Patient instructed in methods to reduce muscle soreness and to continue prescribed HEP. If patient was dry needled over the lung field, patient was instructed on signs and symptoms of pneumothorax and, however unlikely, to see immediate medical attention should they occur. Patient was also educated on signs and symptoms of infection and to seek medical attention should they occur. Patient verbalized understanding of these instructions and education.   [facility] Adult PT Treatment:                                                DATE: 04/11/2023 Therapeutic Exercise: UBE L3 x 4 min (fwd/bwd) while taking subjective Sidelying thoracic rotation x 10 Doorway pec stretch at 90 deg 3 x 20 sec Seated thoracic extension x 10 Seated chin tuck 10 x 5 sec Seated hamstring stretch 2 x 30 sec each Row with blue 2 x 20 Seated double ER and scap retraction with green x 20 Seated horizontal abduction with green x 20 Manual Therapy: Skilled palpation and monitoring of muscle tension while performing TPDN STM / TPR left neck and shoulder region Trigger Point Dry Needling Treatment: Pre-treatment instruction: Patient instructed on dry needling rationale, procedures, and possible side effects including pain during treatment (achy,cramping feeling), bruising, drop of blood, lightheadedness, nausea, sweating. Patient Consent Given: Yes Education handout provided: Yes Muscles treated: left upper trap, rhomboids/mid trap region, infraspinatus, posterior deltoid Needle size and number: .30x10mm x 7 Electrical stimulation performed: No Parameters: N/A Treatment response/outcome: Twitch response elicited and Palpable decrease in muscle tension Post-treatment instructions: Patient instructed to expect possible mild to moderate muscle soreness later today and/or  tomorrow. Patient instructed in methods to reduce muscle soreness and to continue prescribed HEP. If patient was dry needled over the lung field, patient was instructed on signs and symptoms of pneumothorax and, however unlikely, to see immediate medical attention should they occur. Patient was also educated on signs and symptoms of infection and to seek medical attention should they occur. Patient verbalized understanding of these instructions and education.  [facility] Adult PT Treatment:                                                DATE: 04/06/2023 Therapeutic Exercise: UBE L3 x 4 min (fwd/bwd) while taking subjective Sidelying thoracic rotation x 10 Doorway pec stretch at 90 deg 3 x 20 sec Seated thoracic extension x 10 Seated chin tuck x 15 Seated double ER and scap retraction with green x 20 Seated horizontal abduction with green x 20 Row with black x 20  PATIENT EDUCATION:  Education details: POC discharge, HEP update Person educated: Patient Education method: Explanation, Demonstration, Handout Education comprehension: Verbalized understanding, returned demonstration   HOME EXERCISE PROGRAM: Access Code: [email or web address]   ASSESSMENT: CLINICAL IMPRESSION: Patient tolerated therapy well with no adverse effects. He has made progress in therapy but continues to report left shoulder and arm numbness that has not improved over the course of therapy and seems to be radicular in nature. He continues to exhibit mobility deficits of the spine and strength/endurance deficits of periscapular and DNF muscles leading to postural deviations. Patient does report overall improvement in tightness and rib cage pain has resolved. Patient was encouraged to follow-up with PCP regarding referral to spine/neurosurgeon for assessment of cervical radiculopathy and patient was provided names of surgeons and also PT/chiro he could contact if he would like to pursue further dry needling. Patient is independent with his HEP  and will be discharged from PT due to lack of improvement in left UE numbness.   OBJECTIVE IMPAIRMENTS: decreased knowledge of condition, decreased ROM, decreased strength, hypomobility, increased fascial restrictions, impaired flexibility, improper body mechanics, postural dysfunction, and pain.    ACTIVITY LIMITATIONS: carrying, lifting, bending, and squatting   PARTICIPATION LIMITATIONS: meal prep, cleaning, laundry, shopping, community activity, and yard work   PERSONAL FACTORS: Age and Fitness are also affecting patient's functional outcome.     GOALS: Goals reviewed with patient? Yes   SHORT TERM GOALS: Target date: 03/20/2023   Patient will be independent and compliant with initial HEP.   Baseline: see above 03/23/2023: independent Goal status: MET   2.  Therapist will capture FOTO and set appropriate LTG.  Baseline: not set up at evaluation.  03/23/2023: captured Goal status: MET   3.  Patient will report pain free trunk AROM to improve his ability to complete reaching and bending activity.  Baseline: see above  03/23/2023: continues to report pain 04/13/2023: patient still with limitations Goal status:PARTIALLY MET   4.  Patient will improve hamstring flexibility by at least 10 degrees bilaterally to reduce stress on the back with bending activity.  Baseline: see above  03/23/2023: not assessed Goal status: met   LONG TERM GOALS: Target date: 04/15/2023   Patient will report pain at worst rated as </= 3/10 to reduce current functional limitations.  Baseline: see above 04/13/2023: reports pain 3/10  Goal status: MET   2.  Patient will demonstrate 5/5 middle trap strength to improve postural stability.  Baseline: see above 04/13/2023: continues with limitations Goal status: PARTIALLY MET   3.  Patient will improve trunk flexion AROM by at least 25% to improve bending mechanics.  Baseline: see above 04/13/2023: continues with limitations Goal status: PARTIALLY MET    4.  Patient will be independent with advanced home program to progress/maintain current level of function.  Baseline: see above 04/13/2023: independent Goal status: MET   5.  Patient will score at least 66% on FOTO to signify clinically meaningful improvement in functional abilities.  Baseline: see above 03/30/2023: 72% Goal status: MET     PLAN: PT FREQUENCY: 1-2x/week   PT DURATION: 6 weeks   PLANNED INTERVENTIONS: Therapeutic exercises, Therapeutic activity, Neuromuscular re-education, Balance training, Patient/Family education, Self Care, Joint mobilization, Dry Needling, Electrical stimulation, Spinal manipulation, Spinal mobilization, Cryotherapy, Moist heat, Taping, Manual therapy, and Re-evaluation.   PLAN FOR NEXT SESSION:  NA - discharge    Rosana Hoes, PT, DPT, LAT, ATC 04/13/23  9:52 AM Phone: [phone] Fax: [phone]   PHYSICAL THERAPY DISCHARGE  SUMMARY  Visits from Start of Care: 11  Current functional level related to goals / functional outcomes: See above   Remaining deficits: See above   Education / Equipment: HEP   Patient agrees to discharge. Patient goals were partially met. Patient is being discharged due to lack of progress.

## 2023-04-13 ENCOUNTER — Ambulatory Visit: Payer: Medicare Other | Admitting: Physical Therapy

## 2023-04-13 ENCOUNTER — Encounter: Payer: Self-pay | Admitting: Physical Therapy

## 2023-04-13 ENCOUNTER — Other Ambulatory Visit: Payer: Self-pay

## 2023-04-13 DIAGNOSIS — R293 Abnormal posture: Secondary | ICD-10-CM

## 2023-04-13 DIAGNOSIS — M9908 Segmental and somatic dysfunction of rib cage: Secondary | ICD-10-CM

## 2023-04-13 DIAGNOSIS — M6281 Muscle weakness (generalized): Secondary | ICD-10-CM

## 2023-04-13 DIAGNOSIS — R29898 Other symptoms and signs involving the musculoskeletal system: Secondary | ICD-10-CM

## 2023-04-13 NOTE — Patient Instructions (Signed)
Access Code: [email or web address] URL: [email or web address] Date: 04/13/2023 Prepared by: Rosana Hoes  Exercises - Sidelying Thoracic Rotation with Open Book  - 1 x daily - 10 reps - Thoracic Mobilization on Foam Roll - Hands Clasped  - 1 x daily - 10 reps - Seated Hamstring Stretch  - 1 x daily - 3 sets - 30 sec  hold - Doorway Pec Stretch at 90 Degrees Abduction  - 1 x daily - 3 sets - 30 sec  hold - Cervical Retraction at Wall  - 1 x daily - 10 reps - 5 seconds hold - Shoulder External Rotation and Scapular Retraction with Resistance  - 1 x daily - 20 reps - Standing Shoulder Horizontal Abduction with Resistance  - 1 x daily - 20 reps

## 2023-04-17 ENCOUNTER — Other Ambulatory Visit: Payer: Self-pay | Admitting: Internal Medicine

## 2023-04-17 DIAGNOSIS — I10 Essential (primary) hypertension: Secondary | ICD-10-CM

## 2023-04-17 DIAGNOSIS — E785 Hyperlipidemia, unspecified: Secondary | ICD-10-CM

## 2023-06-28 ENCOUNTER — Telehealth (HOSPITAL_BASED_OUTPATIENT_CLINIC_OR_DEPARTMENT_OTHER): Payer: Self-pay | Admitting: Cardiology

## 2023-06-28 DIAGNOSIS — R911 Solitary pulmonary nodule: Secondary | ICD-10-CM

## 2023-06-28 DIAGNOSIS — R918 Other nonspecific abnormal finding of lung field: Secondary | ICD-10-CM

## 2023-06-28 NOTE — Addendum Note (Signed)
Addended by: Marlene Lard on: 06/28/2023 03:23 PM   Modules accepted: Orders

## 2023-06-28 NOTE — Telephone Encounter (Signed)
Spoke with patient regarding the Monday 07/17/23 8:00 am CT chest without contrast schedule at [facility]---arrival time is 7:30 am 1st floor admissions office for check in--arrive thry entrance A.  Patient voiced his understanding and I will mail information to hime

## 2023-06-28 NOTE — Telephone Encounter (Signed)
Left message for patient to call and discuss scheduling the follow up CT chest ordered by Dr. Bjorn Pippin

## 2023-07-17 ENCOUNTER — Ambulatory Visit (HOSPITAL_COMMUNITY)
Admission: RE | Admit: 2023-07-17 | Discharge: 2023-07-17 | Disposition: A | Discharge status: Home / Self Care | Payer: Medicare Other | Source: Ambulatory Visit | Attending: Cardiology | Admitting: Cardiology

## 2023-07-17 DIAGNOSIS — R911 Solitary pulmonary nodule: Secondary | ICD-10-CM | POA: Insufficient documentation

## 2023-07-22 ENCOUNTER — Encounter: Payer: Self-pay | Admitting: Internal Medicine

## 2023-09-29 ENCOUNTER — Other Ambulatory Visit: Payer: Self-pay | Admitting: Internal Medicine

## 2023-09-29 DIAGNOSIS — N401 Enlarged prostate with lower urinary tract symptoms: Secondary | ICD-10-CM

## 2024-01-07 ENCOUNTER — Other Ambulatory Visit: Payer: Self-pay | Admitting: Internal Medicine

## 2024-01-07 DIAGNOSIS — I10 Essential (primary) hypertension: Secondary | ICD-10-CM

## 2024-01-07 DIAGNOSIS — E785 Hyperlipidemia, unspecified: Secondary | ICD-10-CM

## 2024-01-08 LAB — PSA: PSA: 4.5

## 2024-01-12 ENCOUNTER — Other Ambulatory Visit: Payer: Self-pay | Admitting: Urology

## 2024-01-12 DIAGNOSIS — R972 Elevated prostate specific antigen [PSA]: Secondary | ICD-10-CM

## 2024-02-05 ENCOUNTER — Other Ambulatory Visit: Payer: Self-pay | Admitting: Internal Medicine

## 2024-02-05 DIAGNOSIS — I10 Essential (primary) hypertension: Secondary | ICD-10-CM

## 2024-02-05 DIAGNOSIS — E785 Hyperlipidemia, unspecified: Secondary | ICD-10-CM

## 2024-02-15 ENCOUNTER — Other Ambulatory Visit: Payer: Self-pay | Admitting: Internal Medicine

## 2024-02-15 DIAGNOSIS — E785 Hyperlipidemia, unspecified: Secondary | ICD-10-CM

## 2024-02-22 ENCOUNTER — Ambulatory Visit
Admission: RE | Admit: 2024-02-22 | Discharge: 2024-02-22 | Disposition: A | Discharge status: Home / Self Care | Payer: Medicare Other | Source: Ambulatory Visit | Attending: Urology

## 2024-02-22 DIAGNOSIS — R972 Elevated prostate specific antigen [PSA]: Secondary | ICD-10-CM

## 2024-02-22 MED ORDER — GADOPICLENOL 0.5 MMOL/ML IV SOLN
9.0000 mL | Freq: Once | INTRAVENOUS | Status: AC | PRN
  Administered 2024-02-22: 9 mL via INTRAVENOUS

## 2024-02-27 ENCOUNTER — Other Ambulatory Visit: Payer: Self-pay | Admitting: Internal Medicine

## 2024-02-27 DIAGNOSIS — E785 Hyperlipidemia, unspecified: Secondary | ICD-10-CM

## 2024-03-18 ENCOUNTER — Encounter: Payer: Self-pay | Admitting: Internal Medicine

## 2024-03-18 ENCOUNTER — Ambulatory Visit: Payer: Medicare Other | Admitting: Internal Medicine

## 2024-03-18 VITALS — BP 126/78 | HR 100 | Temp 98.2°F | Resp 16 | Ht 72.0 in | Wt 192.6 lb

## 2024-03-18 DIAGNOSIS — G608 Other hereditary and idiopathic neuropathies: Secondary | ICD-10-CM

## 2024-03-18 DIAGNOSIS — I10 Essential (primary) hypertension: Secondary | ICD-10-CM | POA: Diagnosis not present

## 2024-03-18 DIAGNOSIS — Z0000 Encounter for general adult medical examination without abnormal findings: Secondary | ICD-10-CM

## 2024-03-18 DIAGNOSIS — Z0001 Encounter for general adult medical examination with abnormal findings: Secondary | ICD-10-CM

## 2024-03-18 DIAGNOSIS — E785 Hyperlipidemia, unspecified: Secondary | ICD-10-CM | POA: Diagnosis not present

## 2024-03-18 DIAGNOSIS — R000 Tachycardia, unspecified: Secondary | ICD-10-CM | POA: Diagnosis not present

## 2024-03-18 DIAGNOSIS — K219 Gastro-esophageal reflux disease without esophagitis: Secondary | ICD-10-CM

## 2024-03-18 DIAGNOSIS — N401 Enlarged prostate with lower urinary tract symptoms: Secondary | ICD-10-CM

## 2024-03-18 DIAGNOSIS — Z1159 Encounter for screening for other viral diseases: Secondary | ICD-10-CM

## 2024-03-18 DIAGNOSIS — Z23 Encounter for immunization: Secondary | ICD-10-CM

## 2024-03-18 DIAGNOSIS — M48062 Spinal stenosis, lumbar region with neurogenic claudication: Secondary | ICD-10-CM

## 2024-03-18 DIAGNOSIS — R3911 Hesitancy of micturition: Secondary | ICD-10-CM

## 2024-03-18 LAB — HEPATIC FUNCTION PANEL
ALT: 32 U/L (ref 0–53)
AST: 20 U/L (ref 0–37)
Albumin: 4.8 g/dL (ref 3.5–5.2)
Alkaline Phosphatase: 47 U/L (ref 39–117)
Bilirubin, Direct: 0.1 mg/dL (ref 0.0–0.3)
Total Bilirubin: 0.4 mg/dL (ref 0.2–1.2)
Total Protein: 7.3 g/dL (ref 6.0–8.3)

## 2024-03-18 LAB — LIPID PANEL
Cholesterol: 148 mg/dL (ref 0–200)
HDL: 66.6 mg/dL (ref >39.00)
LDL Cholesterol: 64 mg/dL (ref 0–99)
NonHDL: 81.89
Total CHOL/HDL Ratio: 2
Triglycerides: 88 mg/dL (ref 0.0–149.0)
VLDL: 17.6 mg/dL (ref 0.0–40.0)

## 2024-03-18 LAB — CBC WITH DIFFERENTIAL/PLATELET
Basophils Absolute: 0 10*3/uL (ref 0.0–0.1)
Basophils Relative: 0.5 % (ref 0.0–3.0)
Eosinophils Absolute: 0.1 10*3/uL (ref 0.0–0.7)
Eosinophils Relative: 1.6 % (ref 0.0–5.0)
HCT: 44.5 % (ref 39.0–52.0)
Hemoglobin: 15.2 g/dL (ref 13.0–17.0)
Lymphocytes Relative: 28.4 % (ref 12.0–46.0)
Lymphs Abs: 1.5 10*3/uL (ref 0.7–4.0)
MCHC: 34.1 g/dL (ref 30.0–36.0)
MCV: 95.1 fl (ref 78.0–100.0)
Monocytes Absolute: 0.4 10*3/uL (ref 0.1–1.0)
Monocytes Relative: 7.5 % (ref 3.0–12.0)
Neutro Abs: 3.3 10*3/uL (ref 1.4–7.7)
Neutrophils Relative %: 62 % (ref 43.0–77.0)
Platelets: 282 10*3/uL (ref 150.0–400.0)
RBC: 4.68 Mil/uL (ref 4.22–5.81)
RDW: 13.8 % (ref 11.5–15.5)
WBC: 5.4 10*3/uL (ref 4.0–10.5)

## 2024-03-18 LAB — VITAMIN B12: Vitamin B-12: 423 pg/mL (ref 211–911)

## 2024-03-18 LAB — BASIC METABOLIC PANEL
BUN: 15 mg/dL (ref 6–23)
CO2: 27 meq/L (ref 19–32)
Calcium: 9.5 mg/dL (ref 8.4–10.5)
Chloride: 102 meq/L (ref 96–112)
Creatinine, Ser: 0.9 mg/dL (ref 0.40–1.50)
GFR: 87.14 mL/min (ref >60.00)
Glucose, Bld: 93 mg/dL (ref 70–99)
Potassium: 4.3 meq/L (ref 3.5–5.1)
Sodium: 139 meq/L (ref 135–145)

## 2024-03-18 LAB — FOLATE: Folate: 12.8 ng/mL (ref >5.9)

## 2024-03-18 LAB — HEMOGLOBIN A1C: Hgb A1c MFr Bld: 5.3 % (ref 4.6–6.5)

## 2024-03-18 LAB — TSH: TSH: 1.53 u[IU]/mL (ref 0.35–5.50)

## 2024-03-18 MED ORDER — ATORVASTATIN CALCIUM 40 MG PO TABS: 40.0000 mg | ORAL_TABLET | Freq: Every day | ORAL | 1 refills | Status: DC

## 2024-03-18 MED ORDER — BOOSTRIX 5-2.5-18.5 LF-MCG/0.5 IM SUSP: 0.5000 mL | Freq: Once | INTRAMUSCULAR | 0 refills | Status: AC

## 2024-03-18 MED ORDER — TADALAFIL 5 MG PO TABS: 5.0000 mg | ORAL_TABLET | Freq: Every day | ORAL | 1 refills | Status: AC

## 2024-03-18 MED ORDER — SHINGRIX 50 MCG/0.5ML IM SUSR: 0.5000 mL | Freq: Once | INTRAMUSCULAR | 1 refills | Status: AC

## 2024-03-18 NOTE — Patient Instructions (Signed)
 Health Maintenance, Male  Adopting a healthy lifestyle and getting preventive care are important in promoting health and wellness. Ask your health care provider about:  The right schedule for you to have regular tests and exams.  Things you can do on your own to prevent diseases and keep yourself healthy.  What should I know about diet, weight, and exercise?  Eat a healthy diet    Eat a diet that includes plenty of vegetables, fruits, low-fat dairy products, and lean protein.  Do not eat a lot of foods that are high in solid fats, added sugars, or sodium.  Maintain a healthy weight  Body mass index (BMI) is a measurement that can be used to identify possible weight problems. It estimates body fat based on height and weight. Your health care provider can help determine your BMI and help you achieve or maintain a healthy weight.  Get regular exercise  Get regular exercise. This is one of the most important things you can do for your health. Most adults should:  Exercise for at least 150 minutes each week. The exercise should increase your heart rate and make you sweat (moderate-intensity exercise).  Do strengthening exercises at least twice a week. This is in addition to the moderate-intensity exercise.  Spend less time sitting. Even light physical activity can be beneficial.  Watch cholesterol and blood lipids  Have your blood tested for lipids and cholesterol at 70 years of age, then have this test every 5 years.  You may need to have your cholesterol levels checked more often if:  Your lipid or cholesterol levels are high.  You are older than 70 years of age.  You are at high risk for heart disease.  What should I know about cancer screening?  Many types of cancers can be detected early and may often be prevented. Depending on your health history and family history, you may need to have cancer screening at various ages. This may include screening for:  Colorectal cancer.  Prostate cancer.  Skin cancer.  Lung  cancer.  What should I know about heart disease, diabetes, and high blood pressure?  Blood pressure and heart disease  High blood pressure causes heart disease and increases the risk of stroke. This is more likely to develop in people who have high blood pressure readings or are overweight.  Talk with your health care provider about your target blood pressure readings.  Have your blood pressure checked:  Every 3-5 years if you are 9-95 years of age.  Every year if you are 85 years old or older.  If you are between the ages of 29 and 29 and are a current or former smoker, ask your health care provider if you should have a one-time screening for abdominal aortic aneurysm (AAA).  Diabetes  Have regular diabetes screenings. This checks your fasting blood sugar level. Have the screening done:  Once every three years after age 23 if you are at a normal weight and have a low risk for diabetes.  More often and at a younger age if you are overweight or have a high risk for diabetes.  What should I know about preventing infection?  Hepatitis B  If you have a higher risk for hepatitis B, you should be screened for this virus. Talk with your health care provider to find out if you are at risk for hepatitis B infection.  Hepatitis C  Blood testing is recommended for:  Everyone born from [birth year] through 1967.  Anyone  with known risk factors for hepatitis C.  Sexually transmitted infections (STIs)  You should be screened each year for STIs, including gonorrhea and chlamydia, if:  You are sexually active and are younger than 70 years of age.  You are older than 70 years of age and your health care provider tells you that you are at risk for this type of infection.  Your sexual activity has changed since you were last screened, and you are at increased risk for chlamydia or gonorrhea. Ask your health care provider if you are at risk.  Ask your health care provider about whether you are at high risk for HIV. Your health care provider  may recommend a prescription medicine to help prevent HIV infection. If you choose to take medicine to prevent HIV, you should first get tested for HIV. You should then be tested every 3 months for as long as you are taking the medicine.  Follow these instructions at home:  Alcohol use  Do not drink alcohol if your health care provider tells you not to drink.  If you drink alcohol:  Limit how much you have to 0-2 drinks a day.  Know how much alcohol is in your drink. In the [place] one drink equals one 12 oz bottle of beer (355 mL), one 5 oz glass of wine (148 mL), or one 1 oz glass of hard liquor (44 mL).  Lifestyle  Do not use any products that contain nicotine or tobacco. These products include cigarettes, chewing tobacco, and vaping devices, such as e-cigarettes. If you need help quitting, ask your health care provider.  Do not use street drugs.  Do not share needles.  Ask your health care provider for help if you need support or information about quitting drugs.  General instructions  Schedule regular health, dental, and eye exams.  Stay current with your vaccines.  Tell your health care provider if:  You often feel depressed.  You have ever been abused or do not feel safe at home.  Summary  Adopting a healthy lifestyle and getting preventive care are important in promoting health and wellness.  Follow your health care provider's instructions about healthy diet, exercising, and getting tested or screened for diseases.  Follow your health care provider's instructions on monitoring your cholesterol and blood pressure.  This information is not intended to replace advice given to you by your health care provider. Make sure you discuss any questions you have with your health care provider.  Document Revised: 05/03/2021 Document Reviewed: 05/03/2021  Elsevier Patient Education  2024 [employer].

## 2024-03-18 NOTE — Progress Notes (Signed)
 Subjective:  Patient ID: Steven Sanford, male    DOB: Feb 04, 1956  Age: 68 y.o. MRN: [id number]  CC: Annual Exam, Hypertension, Hyperlipidemia, and Gastroesophageal Reflux   HPI Steven Sanford presents for a CPX and f/up ----  Discussed the use of AI scribe software for clinical note transcription with the patient, who gave verbal consent to proceed.  History of Present Illness   Steven Sanford "CJ" is a 68 year old male who presents for follow-up on cardiac and spinal issues.  He has a history of elevated heart rate, typically in the high 80s to 90s, and currently around 100. No chest pain or shortness of breath during physical activities such as running or walking. He maintains an active lifestyle, walking 3-5 minutes to an hour, 3-4 days a week, and also works out with [employer]. He has not been able to refill one of his heart medications, possibly anaparil.  He discusses ongoing issues related to spinal stenosis at L2, L3, and L4, which cause pain in the upper back, especially on the left side, and numbness in his feet, toes, left arm, and fingers. The numbness is persistent and has been worsening over the years. He finds relief through stretching and working out, but symptoms return with certain activities.  Regarding his prostate health, he mentions a previous MRI that did not show concerning results, although he has noticed an increase in urinary frequency recently. He recalls undergoing a urine test with his urologist. No difficulty urinating or a weak stream.  He is currently taking atorvastatin but has run out of Dexilant and amlodipine, the latter of which he has been unable to refill. He is concerned about potential heavy metal exposure due to frequent fish consumption over the past 20-25 years, particularly fish high in mercury like swordfish.       Outpatient Medications Prior to Visit  Medication Sig Dispense Refill   dexlansoprazole (DEXILANT) 60 MG capsule TAKE 1 CAPSULE BY  MOUTH DAILY 90 capsule 1   amLODipine (NORVASC) 5 MG tablet Take 5 mg by mouth daily.     atorvastatin (LIPITOR) 40 MG tablet TAKE 1 TABLET BY MOUTH EVERY DAY 90 tablet 1   enalapril (VASOTEC) 10 MG tablet TAKE 1 TABLET BY MOUTH EVERY DAY (Patient not taking: Reported on 03/18/2024) 90 tablet 1   tadalafil (CIALIS) 5 MG tablet Take 1 tablet (5 mg total) by mouth daily. (Patient taking differently: Take 5 mg by mouth as needed for erectile dysfunction.) 90 tablet 1   No facility-administered medications prior to visit.    ROS Review of Systems  Constitutional: Negative.  Negative for appetite change, diaphoresis, fatigue and unexpected weight change.  HENT: Negative.    Eyes: Negative.  Negative for visual disturbance.  Respiratory: Negative.  Negative for cough, chest tightness, shortness of breath and wheezing.   Cardiovascular:  Negative for chest pain, palpitations and leg swelling.  Gastrointestinal: Negative.  Negative for abdominal pain, constipation, diarrhea, nausea and vomiting.  Endocrine: Negative.   Genitourinary: Negative.  Negative for difficulty urinating and hematuria.  Musculoskeletal:  Positive for back pain. Negative for arthralgias, myalgias and neck pain.  Skin: Negative.  Negative for color change.  Neurological:  Positive for numbness. Negative for dizziness, weakness and light-headedness.  Hematological:  Negative for adenopathy. Does not bruise/bleed easily.  Psychiatric/Behavioral: Negative.      Objective:  BP 126/78 (BP Location: Right Arm, Patient Position: Sitting, Cuff Size: Normal)   Pulse 100   Temp 98.2 F (36.8 C) (  Oral)   Resp 16   Ht 6' (1.829 m)   Wt 192 lb 9.6 oz (87.4 kg)   SpO2 97%   BMI 26.12 kg/m   BP Readings from Last 3 Encounters:  03/18/24 126/78  03/23/23 110/60  02/07/23 138/78    Wt Readings from Last 3 Encounters:  03/18/24 192 lb 9.6 oz (87.4 kg)  03/23/23 188 lb (85.3 kg)  02/07/23 190 lb (86.2 kg)    Physical  Exam Vitals reviewed.  Constitutional:      Appearance: Normal appearance.  HENT:     Nose: Nose normal.     Mouth/Throat:     Mouth: Mucous membranes are moist.  Eyes:     General: No scleral icterus.    Conjunctiva/sclera: Conjunctivae normal.  Cardiovascular:     Rate and Rhythm: Normal rate and regular rhythm.     Heart sounds: Normal heart sounds, S1 normal and S2 normal.     No friction rub. No gallop.     Comments: EKG- NSR, 92 bpm Low voltage No LVH, Q waves, or ST/T wave changes  Unchanged Pulmonary:     Effort: Pulmonary effort is normal.     Breath sounds: No stridor. No wheezing, rhonchi or rales.  Abdominal:     General: Abdomen is flat.     Palpations: There is no mass.     Tenderness: There is no abdominal tenderness. There is no guarding.     Hernia: No hernia is present.  Musculoskeletal:     Cervical back: Neck supple.     Right lower leg: No edema.     Left lower leg: No edema.  Skin:    General: Skin is warm and dry.  Neurological:     General: No focal deficit present.     Mental Status: He is alert. Mental status is at baseline.  Psychiatric:        Mood and Affect: Mood normal.        Behavior: Behavior normal.     Lab Results  Component Value Date   WBC 5.4 03/18/2024   HGB 15.2 03/18/2024   HCT 44.5 03/18/2024   PLT 282.0 03/18/2024   GLUCOSE 93 03/18/2024   CHOL 148 03/18/2024   TRIG 88.0 03/18/2024   HDL 66.60 03/18/2024   LDLCALC 64 03/18/2024   ALT 32 03/18/2024   AST 20 03/18/2024   NA 139 03/18/2024   K 4.3 03/18/2024   CL 102 03/18/2024   CREATININE 0.90 03/18/2024   BUN 15 03/18/2024   CO2 27 03/18/2024   TSH 1.53 03/18/2024   PSA 4.5 01/08/2024   HGBA1C 5.3 03/18/2024    MR PROSTATE W WO CONTRAST Result Date: 03/04/2024 CLINICAL DATA:  Elevated PSA level.  R97.20 EXAM: MR PROSTATE WITHOUT AND WITH CONTRAST TECHNIQUE: Multiplanar multisequence MRI images were obtained of the pelvis centered about the prostate. Pre  and post contrast images were obtained. CONTRAST:  9 cc [employer] COMPARISON:  CT pelvis 01/20/2023 FINDINGS: Prostate: Encapsulated nodularity in the transition zone compatible with benign prostatic hypertrophy. A 1.2 by 1.0 by 0.9 cm cystic lesion protruding into the bladder lumen along the upper margin of the prostate is most compatible with cystic degeneration of the median lobe. Hazy low T2 signal in the peripheral zone is nonfocal, likely postinflammatory, and is considered PI-RADS category 2. No focal lesion of intermediate or higher suspicion for prostate cancer is identified. Volume: Ellipsoid volume calculation: The prostate gland measures 5.3 by 3.6 by 4.6 cm (  volume = 46 cm^3). Transcapsular spread: Absent Seminal vesicle involvement: Absent Neurovascular bundle involvement: Absent Pelvic adenopathy: Absent Bone metastasis: Absent Other findings: Subtle differential enhancement in the right posterior bladder wall for example on image 183 series 12, but without well-defined lesion on the T2 weighted images or diffusion weighted sequences. This area looked normal on the CT scan from 01/20/2023. Cannot completely exclude the possibility of sessile urothelial tumor, consider cystoscopy. IMPRESSION: 1. No focal lesion of intermediate or higher suspicion for prostate cancer is identified. 2. Benign prostatic hypertrophy and cystic degeneration of the median lobe. 3. Subtle differential enhancement in the right posterior bladder wall, but without well-defined lesion on the T2 weighted images or diffusion weighted sequences. This area looks normal on the CT scan from 01/20/2023. Cannot completely exclude the possibility of sessile urothelial tumor, consider cystoscopy. Electronically Signed   By: Gaylyn Rong M.D.   On: 03/04/2024 16:50    Assessment & Plan:  Need for hepatitis C screening test -     Hepatitis C antibody; Future  Encounter for general adult medical examination with abnormal findings-  Exam completed, labs reviewed, vaccines reviewed and updated, cancer screenings are UTD, pt ed material was given.   Hyperlipidemia LDL goal <100- LDL goal achieved. Doing well on the statin  -     Lipid panel; Future -     TSH; Future -     Hepatic function panel; Future  Gastroesophageal reflux disease without esophagitis -     CBC with Differential/Platelet; Future  Primary hypertension- BP is well controlled. -     Basic metabolic panel; Future -     CBC with Differential/Platelet; Future  Tachycardia- HR is normal on the EKG. -     EKG 12-Lead -     TSH; Future -     Ambulatory referral to Cardiology  Immunization due -     Pneumococcal conjugate vaccine 20-valent  Need for prophylactic vaccination and inoculation against varicella -     Shingrix; Inject 0.5 mLs into the muscle once for 1 dose.  Dispense: 0.5 mL; Refill: 1  Need for prophylactic vaccination with combined diphtheria-tetanus-pertussis (DTP) vaccine -     Boostrix; Inject 0.5 mLs into the muscle once for 1 dose.  Dispense: 0.5 mL; Refill: 0  Peripheral sensory neuropathy- Arsenic is upper limits of the normal range. -     Heavy Metals Profile, Urine; Future -     Vitamin B12; Future -     Folate; Future -     Vitamin B1; Future -     Zinc; Future -     Hemoglobin A1c; Future  Benign prostatic hyperplasia with urinary hesitancy -     Tadalafil; Take 1 tablet (5 mg total) by mouth daily.  Dispense: 90 tablet; Refill: 1  Spinal stenosis of lumbar region with neurogenic claudication -     Ambulatory referral to Neurosurgery     Follow-up: Return in about 6 months (around 09/18/2024).  Sanda Linger, MD

## 2024-03-23 ENCOUNTER — Encounter: Payer: Self-pay | Admitting: Internal Medicine

## 2024-03-23 LAB — ZINC: Zinc: 70 ug/dL (ref 60–130)

## 2024-03-23 LAB — VITAMIN B1: Vitamin B1 (Thiamine): 8 nmol/L (ref 8–30)

## 2024-03-23 LAB — HEPATITIS C ANTIBODY: Hepatitis C Ab: NONREACTIVE

## 2024-03-23 LAB — HEAVY METALS PROFILE, URINE
Arsenic, 24H Ur: 80 ug/L (ref <=80)
Lead, 24 hr urine: <10 ug/L (ref <80)
Mercury, 24H Ur: <4 ug/L (ref <=20)

## 2024-03-25 ENCOUNTER — Telehealth: Payer: Self-pay

## 2024-04-01 ENCOUNTER — Other Ambulatory Visit (HOSPITAL_COMMUNITY): Payer: Self-pay

## 2024-04-01 ENCOUNTER — Telehealth: Payer: Self-pay

## 2024-04-01 ENCOUNTER — Other Ambulatory Visit: Payer: Self-pay | Admitting: Internal Medicine

## 2024-04-01 NOTE — Telephone Encounter (Signed)
 Pharmacy Patient Advocate Encounter  Received notification from [facility] that Prior Authorization for Tadalafil 5MG  tablets  has been APPROVED from 12/27/23 to 09/30/24. Unable to obtain price due to refill too soon rejection, last fill date 04/01/24 next available fill date6/14/25   PA #/Case ID/Reference #: [id number]

## 2024-04-01 NOTE — Telephone Encounter (Signed)
 Pharmacy Patient Advocate Encounter   Received notification from CoverMyMeds that prior authorization for Tadalafil 5MG  tablets is required/requested.   Insurance verification completed.   The patient is insured through [facility] .   Per test claim: PA required; PA submitted to above mentioned insurance via CoverMyMeds Key/confirmation #/EOC [id number] Status is pending

## 2024-04-02 ENCOUNTER — Ambulatory Visit (HOSPITAL_BASED_OUTPATIENT_CLINIC_OR_DEPARTMENT_OTHER): Admitting: Cardiology

## 2024-04-02 ENCOUNTER — Encounter (HOSPITAL_BASED_OUTPATIENT_CLINIC_OR_DEPARTMENT_OTHER): Payer: Self-pay | Admitting: Cardiology

## 2024-04-02 VITALS — BP 114/66 | HR 91 | Ht 71.0 in | Wt 201.0 lb

## 2024-04-02 DIAGNOSIS — E785 Hyperlipidemia, unspecified: Secondary | ICD-10-CM | POA: Diagnosis not present

## 2024-04-02 DIAGNOSIS — R000 Tachycardia, unspecified: Secondary | ICD-10-CM | POA: Diagnosis not present

## 2024-04-02 DIAGNOSIS — R002 Palpitations: Secondary | ICD-10-CM

## 2024-04-02 DIAGNOSIS — I10 Essential (primary) hypertension: Secondary | ICD-10-CM

## 2024-04-02 DIAGNOSIS — R918 Other nonspecific abnormal finding of lung field: Secondary | ICD-10-CM

## 2024-04-02 NOTE — Progress Notes (Signed)
 Cardiology Office Note:    Date:  04/02/2024   ID:  Steven Sanford, DOB November 09, 1956, MRN [id number]  PCP:  Etta Grandchild, MD  Cardiologist:  Little Ishikawa, MD  Electrophysiologist:  None   Referring MD: Etta Grandchild, MD   Chief Complaint  Patient presents with   Tachycardia    History of Present Illness:    Steven Sanford is a 68 y.o. male with a hx of hypertension, hyperlipidemia, obesity who presents for follow-up.  He was referred by Dr. Yetta Barre for evaluation of tachycardia and hypertension, initially seen on 06/21/2022.    Calcium score 0 on 06/24/2022.  Calcium score also notable for small pulmonary nodules measuring up to 5 mm.  Zio patch x 8 days 06/2022 showed 12 episodes of SVT, longest lasting 19 beats with average rate 132 bpm  Since last clinic visit, he reports he is doing well.  Reports no recent palpitations.  Denies any chest pain, dyspnea, lightheadedness, syncope, lower extremity edema.  He walks 3 days/week for 30 minutes, denies any exertional symptoms.   Past Medical History:  Diagnosis Date   GERD (gastroesophageal reflux disease)    Hypertension     Past Surgical History:  Procedure Laterality Date   EYE SURGERY Left 2022   5 operations in total    Current Medications: Current Meds  Medication Sig   atorvastatin (LIPITOR) 40 MG tablet Take 1 tablet (40 mg total) by mouth daily.   enalapril (VASOTEC) 10 MG tablet TAKE 1 TABLET BY MOUTH EVERY DAY     Allergies:   Penicillins   Social History   Socioeconomic History   Marital status: Married    Spouse name: Not on file   Number of children: Not on file   Years of education: Not on file   Highest education level: Bachelor's degree (e.g., BA, AB, BS)  Occupational History   Not on file  Tobacco Use   Smoking status: Never    Passive exposure: Never   Smokeless tobacco: Never  Substance and Sexual Activity   Alcohol use: Not Currently    Alcohol/week: 2.0 - 4.0 standard drinks of  alcohol    Types: 2 - 4 Standard drinks or equivalent per week   Drug use: Never   Sexual activity: Yes    Partners: Female  Other Topics Concern   Not on file  Social History Narrative   Not on file   Social Drivers of Health   Financial Resource Strain: Patient Declined (03/14/2024)   Overall Financial Resource Strain (CARDIA)    Difficulty of Paying Living Expenses: Patient declined  Food Insecurity: Patient Declined (03/14/2024)   Hunger Vital Sign    Worried About Running Out of Food in the Last Year: Patient declined    Ran Out of Food in the Last Year: Patient declined  Transportation Needs: Patient Declined (03/14/2024)   PRAPARE - Administrator, Civil Service (Medical): Patient declined    Lack of Transportation (Non-Medical): Patient declined  Physical Activity: Sufficiently Active (03/14/2024)   Exercise Vital Sign    Days of Exercise per Week: 4 days    Minutes of Exercise per Session: 40 min  Stress: No Stress Concern Present (03/14/2024)   [employer] of Occupational Health - Occupational Stress Questionnaire    Feeling of Stress : Not at all  Social Connections: Socially Integrated (03/14/2024)   Social Connection and Isolation Panel [NHANES]    Frequency of Communication with Friends and Family: Twice a  week    Frequency of Social Gatherings with Friends and Family: Three times a week    Attends Religious Services: More than 4 times per year    Active Member of Clubs or Organizations: Yes    Attends Engineer, structural: More than 4 times per year    Marital Status: Married     Family History: The patient's family history includes Alcoholism in his father and mother. There is no history of Colon cancer, Esophageal cancer, Liver cancer, Pancreatic cancer, Rectal cancer, or Stomach cancer.  ROS:   Please see the history of present illness.     All other systems reviewed and are negative.  EKGs/Labs/Other Studies Reviewed:    The  following studies were reviewed today:   EKG:   06/21/2022: Normal sinus rhythm, rate 96, low voltage, poor R wave progression  Recent Labs: 03/18/2024: ALT 32; BUN 15; Creatinine, Ser 0.90; Hemoglobin 15.2; Platelets 282.0; Potassium 4.3; Sodium 139; TSH 1.53  Recent Lipid Panel    Component Value Date/Time   CHOL 148 03/18/2024 1415   TRIG 88.0 03/18/2024 1415   HDL 66.60 03/18/2024 1415   CHOLHDL 2 03/18/2024 1415   VLDL 17.6 03/18/2024 1415   LDLCALC 64 03/18/2024 1415   LDLCALC 83 03/25/2021 0930    Physical Exam:    VS:  BP 114/66   Pulse 91   Ht 5\' 11"  (1.803 m)   Wt 201 lb (91.2 kg)   SpO2 98%   BMI 28.03 kg/m     Wt Readings from Last 3 Encounters:  04/02/24 201 lb (91.2 kg)  03/18/24 192 lb 9.6 oz (87.4 kg)  03/23/23 188 lb (85.3 kg)     GEN:  Well nourished, well developed in no acute distress HEENT: Normal NECK: No JVD; No carotid bruits LYMPHATICS: No lymphadenopathy CARDIAC: RRR, no murmurs, rubs, gallops RESPIRATORY:  Clear to auscultation without rales, wheezing or rhonchi  ABDOMEN: Soft, non-tender, non-distended MUSCULOSKELETAL:  No edema; No deformity  SKIN: Warm and dry NEUROLOGIC:  Alert and oriented x 3 PSYCHIATRIC:  Normal affect   ASSESSMENT:    1. Tachycardia   2. Palpitations   3. Essential hypertension   4. Hyperlipidemia, unspecified hyperlipidemia type   5. Pulmonary nodules       PLAN:    Tachycardia/palpitations: Reports resting heart rate has been over 100.  Also is having issues with palpitations but improved with decreasing caffeine use.  Zio patch x 8 days 06/2022 showed 12 episodes of SVT, longest lasting 19 beats with average rate 132 bpm -Reports palpitations have improved, seems related to caffeine  Hypertension: On enalapril 10 mg daily and amlodipine 5 mg daily.  Appears controlled.  Leg pain/numbness: Normal ABIs 06/2022.  Hyperlipidemia: On atorvastatin 40 mg daily.  LDL 64 on 03/18/24.  Calcium score 0 on  06/24/2022.    Pulmonary nodules: Calcium score 06/24/2022 notable for small pulmonary nodules measuring up to 5 mm.  Repeat CT chest 06/2023 showed stable nodules along with new  cluster of nodules with appearance suggestive inflammatory process  RTC as needed  Medication Adjustments/Labs and Tests Ordered: Current medicines are reviewed at length with the patient today.  Concerns regarding medicines are outlined above.  No orders of the defined types were placed in this encounter.  No orders of the defined types were placed in this encounter.   Patient Instructions  Medication Instructions:  Your physician recommends that you continue on your current medications as directed. Please refer to the Current  Medication list given to you today.   Labwork: NONE  Testing/Procedures: NONE  Follow-Up: AS NEEDED     Signed, Little Ishikawa, MD  04/02/2024 11:31 PM    [facility]

## 2024-04-02 NOTE — Patient Instructions (Signed)
 Medication Instructions:  ?Your physician recommends that you continue on your current medications as directed. Please refer to the Current Medication list given to you today.  ? ?Labwork: ?NONE ? ?Testing/Procedures: ?NONE ? ?Follow-Up: ?AS NEEDED  ? ?  ?

## 2024-04-05 NOTE — Telephone Encounter (Signed)
 Created in error

## 2024-05-08 ENCOUNTER — Ambulatory Visit (HOSPITAL_BASED_OUTPATIENT_CLINIC_OR_DEPARTMENT_OTHER): Admitting: Cardiology

## 2024-08-05 ENCOUNTER — Other Ambulatory Visit: Payer: Self-pay | Admitting: Internal Medicine

## 2024-08-05 DIAGNOSIS — E785 Hyperlipidemia, unspecified: Secondary | ICD-10-CM

## 2024-08-05 DIAGNOSIS — I10 Essential (primary) hypertension: Secondary | ICD-10-CM

## 2024-11-03 ENCOUNTER — Other Ambulatory Visit: Payer: Self-pay | Admitting: Internal Medicine

## 2024-11-03 DIAGNOSIS — I10 Essential (primary) hypertension: Secondary | ICD-10-CM

## 2024-12-04 ENCOUNTER — Other Ambulatory Visit: Payer: Self-pay | Admitting: Internal Medicine

## 2024-12-04 DIAGNOSIS — I10 Essential (primary) hypertension: Secondary | ICD-10-CM

## 2024-12-08 ENCOUNTER — Other Ambulatory Visit: Payer: Self-pay | Admitting: Internal Medicine

## 2024-12-08 DIAGNOSIS — I10 Essential (primary) hypertension: Secondary | ICD-10-CM

## 2024-12-21 ENCOUNTER — Other Ambulatory Visit: Payer: Self-pay | Admitting: Internal Medicine

## 2024-12-21 DIAGNOSIS — E785 Hyperlipidemia, unspecified: Secondary | ICD-10-CM

## 2025-01-02 ENCOUNTER — Other Ambulatory Visit: Payer: Self-pay | Admitting: Internal Medicine

## 2025-01-02 DIAGNOSIS — I10 Essential (primary) hypertension: Secondary | ICD-10-CM

## 2025-01-08 ENCOUNTER — Ambulatory Visit: Attending: Cardiology | Admitting: Cardiology

## 2025-01-08 ENCOUNTER — Ambulatory Visit

## 2025-01-08 ENCOUNTER — Telehealth (HOSPITAL_BASED_OUTPATIENT_CLINIC_OR_DEPARTMENT_OTHER): Payer: Self-pay | Admitting: Cardiology

## 2025-01-08 ENCOUNTER — Encounter: Payer: Self-pay | Admitting: Cardiology

## 2025-01-08 ENCOUNTER — Ambulatory Visit: Admitting: Cardiology

## 2025-01-08 VITALS — BP 107/60 | HR 104 | Ht 71.0 in | Wt 207.4 lb

## 2025-01-08 DIAGNOSIS — I10 Essential (primary) hypertension: Secondary | ICD-10-CM

## 2025-01-08 DIAGNOSIS — E785 Hyperlipidemia, unspecified: Secondary | ICD-10-CM | POA: Diagnosis not present

## 2025-01-08 DIAGNOSIS — R002 Palpitations: Secondary | ICD-10-CM

## 2025-01-08 MED ORDER — ATORVASTATIN CALCIUM 40 MG PO TABS: 40.0000 mg | ORAL_TABLET | Freq: Every day | ORAL | 3 refills | Status: AC

## 2025-01-08 MED ORDER — ENALAPRIL MALEATE 10 MG PO TABS: 10.0000 mg | ORAL_TABLET | Freq: Every day | ORAL | 3 refills | Status: AC

## 2025-01-08 NOTE — Telephone Encounter (Signed)
 Please disregard, we moved an appt to today. And all his questions will be answered.

## 2025-01-08 NOTE — Progress Notes (Signed)
 " Cardiology Office Note:    Date:  01/08/2025   ID:  Steven Sanford, DOB 1956/11/28, MRN [id number]  PCP:  Joshua Debby CROME, MD  Cardiologist:  Lonni CROME Nanas, MD  Electrophysiologist:  None   Referring MD: Joshua Debby CROME, MD   Chief Complaint  Patient presents with   Palpitations    History of Present Illness:    Steven Sanford is a 69 y.o. male with a hx of hypertension, hyperlipidemia, obesity who presents for follow-up.  He was referred by Dr. Joshua for evaluation of tachycardia and hypertension, initially seen on 06/21/2022.    Calcium  score 0 on 06/24/2022.  Calcium  score also notable for small pulmonary nodules measuring up to 5 mm.  Zio patch x 8 days 06/2022 showed 12 episodes of SVT, longest lasting 19 beats with average rate 132 bpm  Since last clinic visit, he reports he is doing well.  Reports BP up to 140s at home.  Also had episodes recently where heart rate up to 140s, can last for few hours.  Has happened 2-3 times, last time 1 week ago.  Denies any chest pain or dyspnea.  He walks 3 miles 5 days/week.  BP Readings from Last 3 Encounters:  01/08/25 107/60  04/02/24 114/66  03/18/24 126/78      Past Medical History:  Diagnosis Date   GERD (gastroesophageal reflux disease)    Hypertension     Past Surgical History:  Procedure Laterality Date   EYE SURGERY Left 2022   5 operations in total    Current Medications: Current Meds  Medication Sig   tadalafil  (CIALIS ) 5 MG tablet Take 1 tablet (5 mg total) by mouth daily.   [DISCONTINUED] atorvastatin  (LIPITOR) 40 MG tablet TAKE 1 TABLET BY MOUTH EVERY DAY   [DISCONTINUED] enalapril  (VASOTEC ) 10 MG tablet Take 1 tablet (10 mg total) by mouth daily. Schedule an appt for further refills.     Allergies:   Penicillins   Social History   Socioeconomic History   Marital status: Married    Spouse name: Not on file   Number of children: Not on file   Years of education: Not on file   Highest education  level: Bachelor's degree (e.g., BA, AB, BS)  Occupational History   Not on file  Tobacco Use   Smoking status: Never    Passive exposure: Never   Smokeless tobacco: Never  Substance and Sexual Activity   Alcohol use: Not Currently    Alcohol/week: 2.0 - 4.0 standard drinks of alcohol    Types: 2 - 4 Standard drinks or equivalent per week   Drug use: Never   Sexual activity: Yes    Partners: Female  Other Topics Concern   Not on file  Social History Narrative   Not on file   Social Drivers of Health   Tobacco Use: Low Risk (01/08/2025)   Patient History    Smoking Tobacco Use: Never    Smokeless Tobacco Use: Never    Passive Exposure: Never  Financial Resource Strain: Patient Declined (03/14/2024)   Overall Financial Resource Strain (CARDIA)    Difficulty of Paying Living Expenses: Patient declined  Food Insecurity: Patient Declined (03/14/2024)   Hunger Vital Sign    Worried About Running Out of Food in the Last Year: Patient declined    Ran Out of Food in the Last Year: Patient declined  Transportation Needs: Patient Declined (03/14/2024)   PRAPARE - Administrator, Civil Service (Medical): Patient declined  Lack of Transportation (Non-Medical): Patient declined  Physical Activity: Sufficiently Active (03/14/2024)   Exercise Vital Sign    Days of Exercise per Week: 4 days    Minutes of Exercise per Session: 40 min  Stress: No Stress Concern Present (03/14/2024)   [employer] of Occupational Health - Occupational Stress Questionnaire    Feeling of Stress : Not at all  Social Connections: Socially Integrated (03/14/2024)   Social Connection and Isolation Panel    Frequency of Communication with Friends and Family: Twice a week    Frequency of Social Gatherings with Friends and Family: Three times a week    Attends Religious Services: More than 4 times per year    Active Member of Clubs or Organizations: Yes    Attends Banker Meetings: More  than 4 times per year    Marital Status: Married  Depression (PHQ2-9): Low Risk (03/18/2024)   Depression (PHQ2-9)    PHQ-2 Score: 0  Alcohol Screen: Low Risk (03/14/2024)   Alcohol Screen    Last Alcohol Screening Score (AUDIT): 5  Housing: Patient Declined (03/14/2024)   Housing Stability Vital Sign    Unable to Pay for Housing in the Last Year: Patient declined    Number of Times Moved in the Last Year: Not on file    Homeless in the Last Year: Patient declined  Utilities: Not At Risk (02/04/2023)   AHC Utilities    Threatened with loss of utilities: No  Health Literacy: Not on file     Family History: The patient's family history includes Alcoholism in his father and mother. There is no history of Colon cancer, Esophageal cancer, Liver cancer, Pancreatic cancer, Rectal cancer, or Stomach cancer.  ROS:   Please see the history of present illness.     All other systems reviewed and are negative.  EKGs/Labs/Other Studies Reviewed:    The following studies were reviewed today:   EKG:   06/21/2022: Normal sinus rhythm, rate 96, low voltage, poor R wave progression  Recent Labs: 03/18/2024: ALT 32; BUN 15; Creatinine, Ser 0.90; Hemoglobin 15.2; Platelets 282.0; Potassium 4.3; Sodium 139; TSH 1.53  Recent Lipid Panel    Component Value Date/Time   CHOL 148 03/18/2024 1415   TRIG 88.0 03/18/2024 1415   HDL 66.60 03/18/2024 1415   CHOLHDL 2 03/18/2024 1415   VLDL 17.6 03/18/2024 1415   LDLCALC 64 03/18/2024 1415   LDLCALC 83 03/25/2021 0930    Physical Exam:    VS:  BP 107/60   Pulse (!) 104   Ht 5' 11 (1.803 m)   Wt 207 lb 6.4 oz (94.1 kg)   SpO2 97%   BMI 28.93 kg/m     Wt Readings from Last 3 Encounters:  01/08/25 207 lb 6.4 oz (94.1 kg)  04/02/24 201 lb (91.2 kg)  03/18/24 192 lb 9.6 oz (87.4 kg)     GEN:  Well nourished, well developed in no acute distress HEENT: Normal NECK: No JVD; No carotid bruits LYMPHATICS: No lymphadenopathy CARDIAC: RRR, no  murmurs, rubs, gallops RESPIRATORY:  Clear to auscultation without rales, wheezing or rhonchi  ABDOMEN: Soft, non-tender, non-distended MUSCULOSKELETAL:  No edema; No deformity  SKIN: Warm and dry NEUROLOGIC:  Alert and oriented x 3 PSYCHIATRIC:  Normal affect   ASSESSMENT:    1. Palpitations   2. Primary hypertension   3. Hyperlipidemia LDL goal <100     PLAN:    Tachycardia/palpitations: Reports resting heart rate has been over 100.  Also  is having issues with palpitations but improved with decreasing caffeine use.  Zio patch x 8 days 06/2022 showed 12 episodes of SVT, longest lasting 19 beats with average rate 132 bpm -Reports recent episodes of palpitations concerning for arrhythmia, describes heart rate up to 140s and can last for several hours, will evaluate with Zio patch x 2 weeks  Hypertension: On enalapril  10 mg daily.  Appears controlled.  Leg pain/numbness: Normal ABIs 06/2022.  Hyperlipidemia: On atorvastatin  40 mg daily.  LDL 64 on 03/18/24.  Calcium  score 0 on 06/24/2022.    Pulmonary nodules: Calcium  score 06/24/2022 notable for small pulmonary nodules measuring up to 5 mm.  Repeat CT chest 06/2023 showed stable nodules along with new  cluster of nodules with appearance suggestive inflammatory process  RTC in 6 months  Medication Adjustments/Labs and Tests Ordered: Current medicines are reviewed at length with the patient today.  Concerns regarding medicines are outlined above.  Orders Placed This Encounter  Procedures   LONG TERM MONITOR (3-14 DAYS)   Meds ordered this encounter  Medications   enalapril  (VASOTEC ) 10 MG tablet    Sig: Take 1 tablet (10 mg total) by mouth daily. Schedule an appt for further refills.    Dispense:  90 tablet    Refill:  3   atorvastatin  (LIPITOR) 40 MG tablet    Sig: Take 1 tablet (40 mg total) by mouth daily.    Dispense:  90 tablet    Refill:  3    Patient Instructions  Medication Instructions:  Your physician recommends  that you continue on your current medications as directed. Please refer to the Current Medication list given to you today.  *If you need a refill on your cardiac medications before your next appointment, please call your pharmacy*  Lab Work: N ne If you have labs (blood work) drawn today and your tests are completely normal, you will receive your results only by: MyChart Message (if you have MyChart) OR A paper copy in the mail If you have any lab test that is abnormal or we need to change your treatment, we will call you to review the results.  Testing/Procedures: Zio ZIO XT- Long Term Monitor Instructions  Your physician has requested you wear a ZIO patch monitor for 14 days.  This is a single patch monitor. [employer] supplies one patch monitor per enrollment. Additional stickers are not available. Please do not apply patch if you will be having a Nuclear Stress Test,  Echocardiogram, Cardiac CT, MRI, or Chest Xray during the period you would be wearing the  monitor. The patch cannot be worn during these tests. You cannot remove and re-apply the  ZIO XT patch monitor.  Your ZIO patch monitor will be mailed 3 day USPS to your address on file. It may take 3-5 days  to receive your monitor after you have been enrolled.  Once you have received your monitor, please review the enclosed instructions. Your monitor  has already been registered assigning a specific monitor serial # to you.  Billing and Patient Assistance Program Information  We have supplied [employer] with any of your insurance information on file for billing purposes. [employer] offers a sliding scale Patient Assistance Program for patients that do not have  insurance, or whose insurance does not completely cover the cost of the ZIO monitor.  You must apply for the Patient Assistance Program to qualify for this discounted rate.  To apply, please call [employer] at [phone], select option 4, select option 2,  ask to apply for   Patient Assistance Program. Meredeth will ask your household income, and how many people  are in your household. They will quote your out-of-pocket cost based on that information.  [employer] will also be able to set up a 98-month, interest-free payment plan if needed.  Applying the monitor   Shave hair from upper left chest.  Hold abrader disc by orange tab. Rub abrader in 40 strokes over the upper left chest as  indicated in your monitor instructions.  Clean area with 4 enclosed alcohol pads. Let dry.  Apply patch as indicated in monitor instructions. Patch will be placed under collarbone on left  side of chest with arrow pointing upward.  Rub patch adhesive wings for 2 minutes. Remove white label marked 1. Remove the white  label marked 2. Rub patch adhesive wings for 2 additional minutes.  While looking in a mirror, press and release button in center of patch. A small green light will  flash 3-4 times. This will be your only indicator that the monitor has been turned on.  Do not shower for the first 24 hours. You may shower after the first 24 hours.  Press the button if you feel a symptom. You will hear a small click. Record Date, Time and  Symptom in the Patient Logbook.  When you are ready to remove the patch, follow instructions on the last 2 pages of Patient  Logbook. Stick patch monitor onto the last page of Patient Logbook.  Place Patient Logbook in the blue and white box. Use locking tab on box and tape box closed  securely. The blue and white box has prepaid postage on it. Please place it in the mailbox as  soon as possible. Your physician should have your test results approximately 7 days after the  monitor has been mailed back to [facility].  Call [facility] Customer Care at [phone] if you have questions regarding  your ZIO XT patch monitor. Call them immediately if you see an orange light blinking on your  monitor.  If your monitor falls off in less than 4  days, contact our Monitor department at [phone].  If your monitor becomes loose or falls off after 4 days call [employer] at [phone] for  suggestions on securing your monitor   Follow-Up: At [facility], you and your health needs are our priority.  As part of our continuing mission to provide you with exceptional heart care, our providers are all part of one team.  This team includes your primary Cardiologist (physician) and Advanced Practice Providers or APPs (Physician Assistants and Nurse Practitioners) who all work together to provide you with the care you need, when you need it.  Your next appointment:   6 months  Provider:   Dr. Kate  We recommend signing up for the patient portal called MyChart.  Sign up information is provided on this After Visit Summary.  MyChart is used to connect with patients for Virtual Visits (Telemedicine).  Patients are able to view lab/test results, encounter notes, upcoming appointments, etc.  Non-urgent messages can be sent to your provider as well.   To learn more about what you can do with MyChart, go to [email or web address].   Other Instructions none            Signed, Lonni LITTIE Kate, MD  01/08/2025 1:44 PM    [facility] "

## 2025-01-08 NOTE — Progress Notes (Unsigned)
 Enrolled patient for a 14 day Zio XT  monitor to be mailed to patients home

## 2025-01-08 NOTE — Telephone Encounter (Deleted)
 Patient scheduled a follow up in March. He is experiencing high blood pressure and faster heart rates. He asked if there was any way he could get his bp meds filled. I advised that he should contact his Primary care and set up an appt with them. Please call patient and advise.

## 2025-01-08 NOTE — Patient Instructions (Signed)
 Medication Instructions:  Your physician recommends that you continue on your current medications as directed. Please refer to the Current Medication list given to you today.  *If you need a refill on your cardiac medications before your next appointment, please call your pharmacy*  Lab Work: N ne If you have labs (blood work) drawn today and your tests are completely normal, you will receive your results only by: MyChart Message (if you have MyChart) OR A paper copy in the mail If you have any lab test that is abnormal or we need to change your treatment, we will call you to review the results.  Testing/Procedures: Zio ZIO XT- Long Term Monitor Instructions  Your physician has requested you wear a ZIO patch monitor for 14 days.  This is a single patch monitor. [employer] supplies one patch monitor per enrollment. Additional stickers are not available. Please do not apply patch if you will be having a Nuclear Stress Test,  Echocardiogram, Cardiac CT, MRI, or Chest Xray during the period you would be wearing the  monitor. The patch cannot be worn during these tests. You cannot remove and re-apply the  ZIO XT patch monitor.  Your ZIO patch monitor will be mailed 3 day USPS to your address on file. It may take 3-5 days  to receive your monitor after you have been enrolled.  Once you have received your monitor, please review the enclosed instructions. Your monitor  has already been registered assigning a specific monitor serial # to you.  Billing and Patient Assistance Program Information  We have supplied [employer] with any of your insurance information on file for billing purposes. [employer] offers a sliding scale Patient Assistance Program for patients that do not have  insurance, or whose insurance does not completely cover the cost of the ZIO monitor.  You must apply for the Patient Assistance Program to qualify for this discounted rate.  To apply, please call [employer] at [phone],  select option 4, select option 2, ask to apply for  Patient Assistance Program. M[employer]eth will ask your household income, and how many people  are in your household. They will quote your out-of-pocket cost based on that information.  [employer] will also be able to set up a 28-month, interest-free payment plan if needed.  Applying the monitor   Shave hair from upper left chest.  Hold abrader disc by orange tab. Rub abrader in 40 strokes over the upper left chest as  indicated in your monitor instructions.  Clean area with 4 enclosed alcohol pads. Let dry.  Apply patch as indicated in monitor instructions. Patch will be placed under collarbone on left  side of chest with arrow pointing upward.  Rub patch adhesive wings for 2 minutes. Remove white label marked 1. Remove the white  label marked 2. Rub patch adhesive wings for 2 additional minutes.  While looking in a mirror, press and release button in center of patch. A small green light will  flash 3-4 times. This will be your only indicator that the monitor has been turned on.  Do not shower for the first 24 hours. You may shower after the first 24 hours.  Press the button if you feel a symptom. You will hear a small click. Record Date, Time and  Symptom in the Patient Logbook.  When you are ready to remove the patch, follow instructions on the last 2 pages of Patient  Logbook. Stick patch monitor onto the last page of Patient Logbook.  Place Patient Logbook in  the blue and white box. Use locking tab on box and tape box closed  securely. The blue and white box has prepaid postage on it. Please place it in the mailbox as  soon as possible. Your physician should have your test results approximately 7 days after the  monitor has been mailed back to [facility].  Call [facility] Customer Care at [phone] if you have questions regarding  your ZIO XT patch monitor. Call them immediately if you see an orange light blinking on your   monitor.  If your monitor falls off in less than 4 days, contact our Monitor department at [phone].  If your monitor becomes loose or falls off after 4 days call [employer] at [phone] for  suggestions on securing your monitor   Follow-Up: At [facility], you and your health needs are our priority.  As part of our continuing mission to provide you with exceptional heart care, our providers are all part of one team.  This team includes your primary Cardiologist (physician) and Advanced Practice Providers or APPs (Physician Assistants and Nurse Practitioners) who all work together to provide you with the care you need, when you need it.  Your next appointment:   6 months  Provider:   Dr. Kate  We recommend signing up for the patient portal called MyChart.  Sign up information is provided on this After Visit Summary.  MyChart is used to connect with patients for Virtual Visits (Telemedicine).  Patients are able to view lab/test results, encounter notes, upcoming appointments, etc.  Non-urgent messages can be sent to your provider as well.   To learn more about what you can do with MyChart, go to [email or web address].   Other Instructions none

## 2025-03-03 ENCOUNTER — Ambulatory Visit (HOSPITAL_BASED_OUTPATIENT_CLINIC_OR_DEPARTMENT_OTHER): Admitting: Cardiology
